# Patient Record
Sex: Female | Born: 1955 | Race: White | Hispanic: No | Marital: Single | Smoking: Never smoker
Health system: Southern US, Community
[De-identification: ages and names within clinical notes are randomized; demographics above are authoritative.]

## PROBLEM LIST (undated history)

## (undated) HISTORY — PX: TONSILLECTOMY: SUR1361

## (undated) HISTORY — PX: CHOLECYSTECTOMY: SHX55

---

## 2018-11-30 ENCOUNTER — Emergency Department (HOSPITAL_COMMUNITY)
Admission: EM | Admit: 2018-11-30 | Discharge: 2018-11-30 | Disposition: A | Discharge status: Home / Self Care | Payer: Self-pay | Attending: Emergency Medicine | Admitting: Emergency Medicine

## 2018-11-30 ENCOUNTER — Encounter (HOSPITAL_COMMUNITY): Payer: Self-pay | Admitting: Emergency Medicine

## 2018-11-30 ENCOUNTER — Other Ambulatory Visit: Payer: Self-pay

## 2018-11-30 ENCOUNTER — Emergency Department (HOSPITAL_COMMUNITY): Payer: Self-pay

## 2018-11-30 DIAGNOSIS — R10815 Periumbilic abdominal tenderness: Secondary | ICD-10-CM | POA: Insufficient documentation

## 2018-11-30 DIAGNOSIS — R10813 Right lower quadrant abdominal tenderness: Secondary | ICD-10-CM | POA: Insufficient documentation

## 2018-11-30 DIAGNOSIS — K529 Noninfective gastroenteritis and colitis, unspecified: Secondary | ICD-10-CM | POA: Insufficient documentation

## 2018-11-30 LAB — URINALYSIS, ROUTINE W REFLEX MICROSCOPIC
Bilirubin Urine: NEGATIVE
Glucose, UA: NEGATIVE mg/dL
Hgb urine dipstick: NEGATIVE
Ketones, ur: NEGATIVE mg/dL
Leukocytes,Ua: NEGATIVE
Nitrite: NEGATIVE
Protein, ur: NEGATIVE mg/dL
Specific Gravity, Urine: 1.017 (ref 1.005–1.030)
pH: 5 (ref 5.0–8.0)

## 2018-11-30 LAB — COMPREHENSIVE METABOLIC PANEL
ALT: 35 U/L (ref 0–44)
AST: 34 U/L (ref 15–41)
Albumin: 4 g/dL (ref 3.5–5.0)
Alkaline Phosphatase: 69 U/L (ref 38–126)
Anion gap: 12 (ref 5–15)
BUN: 21 mg/dL (ref 8–23)
CO2: 21 mmol/L — ABNORMAL LOW (ref 22–32)
Calcium: 9.2 mg/dL (ref 8.9–10.3)
Chloride: 107 mmol/L (ref 98–111)
Creatinine, Ser: 0.79 mg/dL (ref 0.44–1.00)
GFR calc Af Amer: >60 mL/min (ref >60)
GFR calc non Af Amer: >60 mL/min (ref >60)
Glucose, Bld: 142 mg/dL — ABNORMAL HIGH (ref 70–99)
Potassium: 4 mmol/L (ref 3.5–5.1)
Sodium: 140 mmol/L (ref 135–145)
Total Bilirubin: 0.7 mg/dL (ref 0.3–1.2)
Total Protein: 6.6 g/dL (ref 6.5–8.1)

## 2018-11-30 LAB — CBC
HCT: 37.9 % (ref 36.0–46.0)
Hemoglobin: 12.1 g/dL (ref 12.0–15.0)
MCH: 27.6 pg (ref 26.0–34.0)
MCHC: 31.9 g/dL (ref 30.0–36.0)
MCV: 86.5 fL (ref 80.0–100.0)
Platelets: 252 10*3/uL (ref 150–400)
RBC: 4.38 MIL/uL (ref 3.87–5.11)
RDW: 13.4 % (ref 11.5–15.5)
WBC: 11.7 10*3/uL — ABNORMAL HIGH (ref 4.0–10.5)
nRBC: 0 % (ref 0.0–0.2)

## 2018-11-30 LAB — LIPASE, BLOOD: Lipase: 22 U/L (ref 11–51)

## 2018-11-30 MED ORDER — OXYCODONE-ACETAMINOPHEN 5-325 MG PO TABS: 1.0000 | ORAL_TABLET | ORAL | 0 refills | Status: DC | PRN

## 2018-11-30 MED ORDER — SODIUM CHLORIDE 0.9 % IV BOLUS: 1000.0000 mL | Freq: Once | INTRAVENOUS | Status: DC

## 2018-11-30 MED ORDER — SODIUM CHLORIDE 0.9% FLUSH
3.0000 mL | Freq: Once | INTRAVENOUS | Status: AC
  Administered 2018-11-30: 3 mL via INTRAVENOUS

## 2018-11-30 MED ORDER — AMOXICILLIN-POT CLAVULANATE 875-125 MG PO TABS: 1.0000 | ORAL_TABLET | Freq: Two times a day (BID) | ORAL | 0 refills | Status: DC

## 2018-11-30 MED ORDER — ONDANSETRON 4 MG PO TBDP: 4.0000 mg | ORAL_TABLET | Freq: Three times a day (TID) | ORAL | 0 refills | Status: DC | PRN

## 2018-11-30 MED ORDER — AMOXICILLIN-POT CLAVULANATE 875-125 MG PO TABS
1.0000 | ORAL_TABLET | Freq: Once | ORAL | Status: AC
  Administered 2018-11-30: 06:00:00 1 via ORAL
  Filled 2018-11-30: qty 1

## 2018-11-30 MED ORDER — IOHEXOL 300 MG/ML  SOLN
100.0000 mL | Freq: Once | INTRAMUSCULAR | Status: AC | PRN
  Administered 2018-11-30: 05:00:00 100 mL via INTRAVENOUS

## 2018-11-30 MED ORDER — OXYCODONE-ACETAMINOPHEN 5-325 MG PO TABS
1.0000 | ORAL_TABLET | Freq: Once | ORAL | Status: AC
  Administered 2018-11-30: 06:00:00 1 via ORAL
  Filled 2018-11-30: qty 1

## 2018-11-30 NOTE — ED Notes (Signed)
Informed Sharilyn Sites PA elevated temp 100.2.

## 2018-11-30 NOTE — ED Notes (Signed)
Patient transported to CT 

## 2018-11-30 NOTE — ED Notes (Signed)
Discharge instructions discussed with pt. Pt. Verbalized understanding and Has no questions at this time. Pt. Discharged home with husband

## 2018-11-30 NOTE — ED Provider Notes (Signed)
[facility] EMERGENCY DEPARTMENT Provider Note   CSN: [id number] Arrival date & time: 11/30/18  0307    History   Chief Complaint Chief Complaint  Patient presents with  . Abdominal Pain    HPI Jasmine Branch is a 63 y.o. female.     The history is provided by the patient and medical records.  Abdominal Pain     62 y.o. F with no significant PMH, presenting to the ED for lower abdominal pain.  States she was taking a bath around 9:30 PM when she started to feel waves of pain in lower abdomen that reminded her of menstrual cramps.  States pain has since started radiating up into her mid-abdomen.  States pain continues coming in waves.  Pain better with lying flat, better with knees curled to chest or bent over.  She denies nausea, vomiting, or diarrhea.  She did not have BM yesterday but otherwise normal and able to pass gas.  She has not had any fever.  No urinary symptoms.  No irregular vaginal bleeding (postmenopausal for several years now).  She has had prior cholecystectomy.  Took motrin around midnight which eased pain a bit.  History reviewed. No pertinent past medical history.  There are no active problems to display for this patient.   History reviewed. No pertinent surgical history.   OB History   No obstetric history on file.      Home Medications    Prior to Admission medications   Not on File    Family History No family history on file.  Social History Social History   Tobacco Use  . Smoking status: Not on file  Substance Use Topics  . Alcohol use: Not on file  . Drug use: Not on file     Allergies   Patient has no known allergies.   Review of Systems Review of Systems  Gastrointestinal: Positive for abdominal pain.  All other systems reviewed and are negative.    Physical Exam Updated Vital Signs BP 106/75   Pulse 97   Temp 98.7 F (37.1 C) (Oral)   Resp 18   Ht  (1.753 m)   Wt 90.7 kg   SpO2 98%    BMI 29.53 kg/m   Physical Exam Vitals signs and nursing note reviewed.  Constitutional:      Appearance: She is well-developed.  HENT:     Head: Normocephalic and atraumatic.  Eyes:     Conjunctiva/sclera: Conjunctivae normal.     Pupils: Pupils are equal, round, and reactive to light.  Neck:     Musculoskeletal: Normal range of motion.  Cardiovascular:     Rate and Rhythm: Normal rate and regular rhythm.     Heart sounds: Normal heart sounds.  Pulmonary:     Effort: Pulmonary effort is normal.     Breath sounds: Normal breath sounds.  Abdominal:     General: Bowel sounds are normal.     Palpations: Abdomen is soft.     Tenderness: There is abdominal tenderness.       Comments: Significant tenderness periumbilical region, mildly so in RLQ and suprapubic  Musculoskeletal: Normal range of motion.  Skin:    General: Skin is warm and dry.  Neurological:     Mental Status: She is alert and oriented to person, place, and time.      ED Treatments / Results  Labs (all labs ordered are listed, but only abnormal results are displayed) Labs Reviewed  COMPREHENSIVE  METABOLIC PANEL - Abnormal; Notable for the following components:      Result Value   CO2 21 (*)    Glucose, Bld 142 (*)    All other components within normal limits  CBC - Abnormal; Notable for the following components:   WBC 11.7 (*)    All other components within normal limits  LIPASE, BLOOD  URINALYSIS, ROUTINE W REFLEX MICROSCOPIC    EKG None  Radiology Ct Abdomen Pelvis W Contrast  Result Date: 11/30/2018 CLINICAL DATA:  63 year old female with right lower quadrant abdominal pain. EXAM: CT ABDOMEN AND PELVIS WITH CONTRAST TECHNIQUE: Multidetector CT imaging of the abdomen and pelvis was performed using the standard protocol following bolus administration of intravenous contrast. CONTRAST:  OMNIPAQUE IOHEXOL 300 MG/ML  SOLN COMPARISON:  None. FINDINGS: Lower chest: Minimal lung base atelectasis.  No pericardial or pleural effusion. Hepatobiliary: Diminutive or absent gallbladder.  Negative liver. Pancreas: Negative. Spleen: Negative. Adrenals/Urinary Tract: Normal adrenal glands. 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 There are benign bilateral renal parapelvic cysts. There is no hydronephrosis. Symmetric renal enhancement and contrast excretion with normal proximal ureters. Unremarkable urinary bladder. Stomach/Bowel: Negative distal sigmoid and rectum. The proximal sigmoid is redundant tracking to the level of the umbilicus, but otherwise negative. There is diverticulosis of the descending colon which is mild-to-moderate. No active inflammation identified. Mildly redundant transverse colon is negative. Negative right colon. The cecum is on a lax mesentery located anteriorly in the right mid abdomen. The appendix is located just caudal to the cecum in the ventral abdomen subjacent to the abdominal wall on series 3, image 54 and appears normal. Negative terminal ileum. But there are mildly thickened and indistinct loops of distal small bowel in the left lower quadrant on series 3, image 70. There is associated mild mesenteric stranding (coronal image 51). Other fluid-filled but nondilated loops in this region may also be mildly inflamed, including in the right lower quadrant. There is no associated SMA atherosclerosis identified. Trace mesenteric free fluid. The mesentery of these affected small bowel loops also appears somewhat sequestered as seen on coronal image 51. No free air. The duodenum and proximal jejunum appear normal. Negative stomach aside from small hiatal hernia. Vascular/Lymphatic: Mild Calcified aortic atherosclerosis. Major arterial structures remain patent. There is no SMA atherosclerosis identified. SMA branches appear normal. Portal venous system is patent. No lymphadenopathy. Reproductive: Within normal limits. Other: Small volume pelvic free fluid in the  cul-de-sac on series 3, image 73. Musculoskeletal: No acute osseous abnormality identified. Benign bone island of the right iliac wing suspected. Advanced disc degeneration at the lumbosacral junction. IMPRESSION: Negative appendix but mildly inflamed distal small bowel loops located in both lower quadrants. Trace free fluid, no free air. The inflamed loops appear somewhat distinct from the remaining small bowel, and the cecum is also on a lax mesentery located in the ventral right abdomen. I favor acute infectious enteritis at this time. But if the patient's condition worsens consider developing closed loop obstruction such as due to internal hernia. Electronically Signed   By: Odessa Fleming M.D.   On: 11/30/2018 05:48    Procedures Procedures (including critical care time)  Medications Ordered in ED Medications  oxyCODONE-acetaminophen (PERCOCET/ROXICET) 5-325 MG per tablet 1 tablet (has no administration in time range)  amoxicillin-clavulanate (AUGMENTIN) 875-125 MG per tablet 1 tablet (has no administration in time range)  sodium chloride flush (NS) 0.9 % injection 3 mL (3 mLs Intravenous Given 11/30/18 0341)  iohexol (OMNIPAQUE) 300 MG/ML solution 100 mL (100 mLs Intravenous Contrast Given 11/30/18 0503)     Initial Impression / Assessment and Plan / ED Course  I have reviewed the triage vital signs and the nursing notes.  Pertinent labs & imaging results that were available during my care of the patient were reviewed by me and considered in my medical decision making (see chart for details).  63 y.o. F here with lower abdominal pain.  Began last night around 9:30 PM, steadily increasing and moving up into mid-abdomen now.  She is afebrile, non-toxic.  Tenderness to peri-umbilical region and mildly in RLQ.  Labs as above-- UA without any signs of infection.  WBC count 11.102F.  Patient with continued increasing pain here.  Will obtain CT scan.  She is refusing pain medication at this time.  CT  without findings of appendicitis, however does have inflamed distal small bowel loops in both lower quadrants.  It is noted that cecum is on a lax mesentery located in ventral right abdomen.  Also recommended to consider closed-loop obstruction due to internal hernia.  She does have findings of hiatal hernia on exam as well as on CT.  Will discuss with general surgery for recommendations and review of CT.  6:09 AM Discussed with general surgery, Dr. Janee Mornhompson-- he has reviewed CT scan, agrees this likely represents infectious enteritis.  He does not appreciate anything surgical at this time.  Appreciative of his input.  Results discussed with patient, she acknowledged understanding.  Will treat with course of abx and pain meds.  She was advised on use of narcotics and not driving, operating machinery, making important decisions, etc.  She will monitor symptoms closely at home, return here for any new/acute changes or worsening.  Final Clinical Impressions(s) / ED Diagnoses   Final diagnoses:  Enteritis    ED Discharge Orders         Ordered    amoxicillin-clavulanate (AUGMENTIN) 875-125 MG tablet  Every 12 hours     11/30/18 0624    oxyCODONE-acetaminophen (PERCOCET) 5-325 MG tablet  Every 4 hours PRN     11/30/18 0624    ondansetron (ZOFRAN ODT) 4 MG disintegrating tablet  Every 8 hours PRN     11/30/18 0624           Garlon HatchetSanders, Alaisha Eversley M, PA-C 11/30/18 [id number]    Palumbo, April, MD 11/30/18 [phone]

## 2018-11-30 NOTE — ED Notes (Addendum)
Pt ambulated to bathroom, only complaint was great pain when walking.

## 2018-11-30 NOTE — ED Triage Notes (Signed)
Pt from home, was taking a bath and began to have sharp, shooting pains that go from her "uterus to my stomach."  The pain then turned to "solid pain that sent me to my knees."  Has some SOB but thinks it is from the anxiety of being in the hospital.  No fever, n/v/d or other complaints.

## 2018-11-30 NOTE — Discharge Instructions (Signed)
Take the prescribed medication as directed.  Do not drive, operate heavy machinery, or make important decisions while taking narcotics. Return to the ED for new or worsening symptoms-- uncontrolled pain, vomiting, not able to hold down medications, etc.

## 2019-02-10 ENCOUNTER — Encounter (HOSPITAL_COMMUNITY): Payer: Self-pay | Admitting: Internal Medicine

## 2019-02-10 ENCOUNTER — Ambulatory Visit (HOSPITAL_COMMUNITY)
Admission: EM | Admit: 2019-02-10 | Discharge: 2019-02-10 | Disposition: A | Discharge status: Home / Self Care | Payer: Self-pay | Attending: Family Medicine | Admitting: Family Medicine

## 2019-02-10 ENCOUNTER — Other Ambulatory Visit: Payer: Self-pay

## 2019-02-10 ENCOUNTER — Inpatient Hospital Stay (HOSPITAL_COMMUNITY)
Admission: EM | Admit: 2019-02-10 | Discharge: 2019-02-13 | DRG: 372 | Disposition: A | Discharge status: Home / Self Care | Payer: Self-pay | Attending: Internal Medicine | Admitting: Internal Medicine

## 2019-02-10 ENCOUNTER — Encounter (HOSPITAL_COMMUNITY): Payer: Self-pay | Admitting: Emergency Medicine

## 2019-02-10 DIAGNOSIS — N179 Acute kidney failure, unspecified: Secondary | ICD-10-CM | POA: Insufficient documentation

## 2019-02-10 DIAGNOSIS — Z9049 Acquired absence of other specified parts of digestive tract: Secondary | ICD-10-CM

## 2019-02-10 DIAGNOSIS — E860 Dehydration: Secondary | ICD-10-CM

## 2019-02-10 DIAGNOSIS — K529 Noninfective gastroenteritis and colitis, unspecified: Secondary | ICD-10-CM | POA: Diagnosis present

## 2019-02-10 DIAGNOSIS — A020 Salmonella enteritis: Principal | ICD-10-CM | POA: Diagnosis present

## 2019-02-10 DIAGNOSIS — E876 Hypokalemia: Secondary | ICD-10-CM

## 2019-02-10 DIAGNOSIS — Z20828 Contact with and (suspected) exposure to other viral communicable diseases: Secondary | ICD-10-CM | POA: Diagnosis present

## 2019-02-10 DIAGNOSIS — A09 Infectious gastroenteritis and colitis, unspecified: Secondary | ICD-10-CM | POA: Insufficient documentation

## 2019-02-10 DIAGNOSIS — Z79899 Other long term (current) drug therapy: Secondary | ICD-10-CM

## 2019-02-10 DIAGNOSIS — K760 Fatty (change of) liver, not elsewhere classified: Secondary | ICD-10-CM | POA: Diagnosis present

## 2019-02-10 DIAGNOSIS — R197 Diarrhea, unspecified: Secondary | ICD-10-CM

## 2019-02-10 LAB — BASIC METABOLIC PANEL
Anion gap: 15 (ref 5–15)
Anion gap: 10 (ref 5–15)
BUN: 33 mg/dL — ABNORMAL HIGH (ref 8–23)
BUN: 31 mg/dL — ABNORMAL HIGH (ref 8–23)
CO2: 16 mmol/L — ABNORMAL LOW (ref 22–32)
CO2: 16 mmol/L — ABNORMAL LOW (ref 22–32)
Calcium: 9.7 mg/dL (ref 8.9–10.3)
Calcium: 8.8 mg/dL — ABNORMAL LOW (ref 8.9–10.3)
Chloride: 103 mmol/L (ref 98–111)
Chloride: 110 mmol/L (ref 98–111)
Creatinine, Ser: 2.06 mg/dL — ABNORMAL HIGH (ref 0.44–1.00)
Creatinine, Ser: 1.59 mg/dL — ABNORMAL HIGH (ref 0.44–1.00)
GFR calc Af Amer: 29 mL/min — ABNORMAL LOW (ref >60)
GFR calc Af Amer: 40 mL/min — ABNORMAL LOW (ref >60)
GFR calc non Af Amer: 25 mL/min — ABNORMAL LOW (ref >60)
GFR calc non Af Amer: 34 mL/min — ABNORMAL LOW (ref >60)
Glucose, Bld: 122 mg/dL — ABNORMAL HIGH (ref 70–99)
Glucose, Bld: 132 mg/dL — ABNORMAL HIGH (ref 70–99)
Potassium: 2.6 mmol/L — CL (ref 3.5–5.1)
Potassium: 2.7 mmol/L — CL (ref 3.5–5.1)
Sodium: 134 mmol/L — ABNORMAL LOW (ref 135–145)
Sodium: 136 mmol/L (ref 135–145)

## 2019-02-10 LAB — COMPREHENSIVE METABOLIC PANEL
ALT: 27 U/L (ref 0–44)
AST: 23 U/L (ref 15–41)
Albumin: 3.6 g/dL (ref 3.5–5.0)
Alkaline Phosphatase: 51 U/L (ref 38–126)
Anion gap: 14 (ref 5–15)
BUN: 36 mg/dL — ABNORMAL HIGH (ref 8–23)
CO2: 17 mmol/L — ABNORMAL LOW (ref 22–32)
Calcium: 9 mg/dL (ref 8.9–10.3)
Chloride: 105 mmol/L (ref 98–111)
Creatinine, Ser: 2 mg/dL — ABNORMAL HIGH (ref 0.44–1.00)
GFR calc Af Amer: 30 mL/min — ABNORMAL LOW (ref >60)
GFR calc non Af Amer: 26 mL/min — ABNORMAL LOW (ref >60)
Glucose, Bld: 124 mg/dL — ABNORMAL HIGH (ref 70–99)
Potassium: 2.6 mmol/L — CL (ref 3.5–5.1)
Sodium: 136 mmol/L (ref 135–145)
Total Bilirubin: 0.7 mg/dL (ref 0.3–1.2)
Total Protein: 6.8 g/dL (ref 6.5–8.1)

## 2019-02-10 LAB — CBC
HCT: 42.9 % (ref 36.0–46.0)
Hemoglobin: 14.3 g/dL (ref 12.0–15.0)
MCH: 27.9 pg (ref 26.0–34.0)
MCHC: 33.3 g/dL (ref 30.0–36.0)
MCV: 83.8 fL (ref 80.0–100.0)
Platelets: 311 10*3/uL (ref 150–400)
RBC: 5.12 MIL/uL — ABNORMAL HIGH (ref 3.87–5.11)
RDW: 13.6 % (ref 11.5–15.5)
WBC: 4.9 10*3/uL (ref 4.0–10.5)
nRBC: 0 % (ref 0.0–0.2)

## 2019-02-10 LAB — C DIFFICILE QUICK SCREEN W PCR REFLEX
C Diff antigen: NEGATIVE
C Diff interpretation: NOT DETECTED
C Diff toxin: NEGATIVE

## 2019-02-10 LAB — MAGNESIUM: Magnesium: 2 mg/dL (ref 1.7–2.4)

## 2019-02-10 LAB — SARS CORONAVIRUS 2 BY RT PCR (HOSPITAL ORDER, PERFORMED IN ~~LOC~~ HOSPITAL LAB): SARS Coronavirus 2: NEGATIVE

## 2019-02-10 MED ORDER — POTASSIUM CHLORIDE CRYS ER 20 MEQ PO TBCR: 20.0000 meq | EXTENDED_RELEASE_TABLET | Freq: Two times a day (BID) | ORAL | 0 refills | Status: DC

## 2019-02-10 MED ORDER — POTASSIUM CHLORIDE 10 MEQ/100ML IV SOLN
10.0000 meq | INTRAVENOUS | Status: AC
  Administered 2019-02-10: 10 meq via INTRAVENOUS
  Filled 2019-02-10: qty 100

## 2019-02-10 MED ORDER — HYDROCODONE-ACETAMINOPHEN 5-325 MG PO TABS
1.0000 | ORAL_TABLET | ORAL | Status: DC | PRN
  Filled 2019-02-10: qty 2

## 2019-02-10 MED ORDER — ACETAMINOPHEN 650 MG RE SUPP: 650.0000 mg | Freq: Four times a day (QID) | RECTAL | Status: DC | PRN

## 2019-02-10 MED ORDER — POTASSIUM CHLORIDE 10 MEQ/100ML IV SOLN
10.0000 meq | INTRAVENOUS | Status: AC
  Administered 2019-02-10 (×2): 10 meq via INTRAVENOUS
  Filled 2019-02-10 (×2): qty 100

## 2019-02-10 MED ORDER — LOPERAMIDE HCL 2 MG PO CAPS: 2.0000 mg | ORAL_CAPSULE | Freq: Four times a day (QID) | ORAL | 0 refills | Status: DC | PRN

## 2019-02-10 MED ORDER — ACETAMINOPHEN 325 MG PO TABS
650.0000 mg | ORAL_TABLET | Freq: Four times a day (QID) | ORAL | Status: DC | PRN
  Administered 2019-02-12: 650 mg via ORAL
  Filled 2019-02-10: qty 2

## 2019-02-10 MED ORDER — SODIUM CHLORIDE 0.9 % IV BOLUS
1000.0000 mL | Freq: Once | INTRAVENOUS | Status: AC
  Administered 2019-02-10: 13:00:00 1000 mL via INTRAVENOUS

## 2019-02-10 MED ORDER — SODIUM CHLORIDE 0.9 % IV SOLN
INTRAVENOUS | Status: AC
  Administered 2019-02-10 (×2): via INTRAVENOUS

## 2019-02-10 MED ORDER — LOPERAMIDE HCL 2 MG PO CAPS
4.0000 mg | ORAL_CAPSULE | Freq: Once | ORAL | Status: AC
  Administered 2019-02-10: 13:00:00 4 mg via ORAL
  Filled 2019-02-10: qty 2

## 2019-02-10 MED ORDER — ONDANSETRON HCL 4 MG PO TABS: 4.0000 mg | ORAL_TABLET | Freq: Four times a day (QID) | ORAL | Status: DC | PRN

## 2019-02-10 MED ORDER — SODIUM CHLORIDE 0.9 % IV BOLUS
1000.0000 mL | Freq: Once | INTRAVENOUS | Status: AC
  Administered 2019-02-10: 1000 mL via INTRAVENOUS

## 2019-02-10 MED ORDER — ONDANSETRON HCL 4 MG/2ML IJ SOLN: 4.0000 mg | Freq: Four times a day (QID) | INTRAMUSCULAR | Status: DC | PRN

## 2019-02-10 MED ORDER — POTASSIUM CHLORIDE 10 MEQ/100ML IV SOLN
10.0000 meq | INTRAVENOUS | Status: AC
  Administered 2019-02-10 – 2019-02-11 (×5): 10 meq via INTRAVENOUS
  Filled 2019-02-10 (×5): qty 100

## 2019-02-10 MED ORDER — POTASSIUM CHLORIDE CRYS ER 20 MEQ PO TBCR
40.0000 meq | EXTENDED_RELEASE_TABLET | Freq: Once | ORAL | Status: AC
  Administered 2019-02-10: 40 meq via ORAL
  Filled 2019-02-10: qty 2

## 2019-02-10 NOTE — H&P (Signed)
Jasmine Branch ZOX:[id number] DOB: 12-08-1955 DOA: 02/10/2019     PCP: Patient, No Pcp Per   Outpatient Specialists:   NONE    Patient arrived to ER on 02/10/19 at 1140  Patient coming from: home Lives   With family    Chief Complaint:  Chief Complaint  Patient presents with  . Diarrhea  . Emesis    HPI: Jasmine Branch is a 63 y.o. female with no significant medical history         Presented with diarrhea for 4 days severe, went to urgent care her K was noted to be  2.6 Cr was up to 2.0 no hx of CKD at baseline  She was given IV fluids because initially she did not wish to be admitted And had persistent diarrhea even while at urgent care following work-up was ordered while there  C.diff was negative, Gi panel ordered Repeat blood work was done showing still persistent AKI and hypo-kalemia at which point she was sent to emergency department  Her husband is healthy has been isolating She is a vegan cooks her own food which she gets from [employer].  They have city water.  No sick contacts currently she has been eating the same food as her husband.  They do a lot of vegetables and fruits.  She states they have been trying to wash her but sometimes may be not doing a very good job.  Reports around May she had some abdominal discomfort and was evaluated with a CAT scan for that she went home and did a cleanse after which she was feeling better up until now  Reports diarrhea resembling pond water up to 6-7 times a day. Associated some abdominal discomfort somewhat worse when she eats No blood in stool no mucus no fevers no chills no nausea vomiting associated with this.  Infectious risk factors:  Reports Diarrhea   In  ER RAPID COVID TEST NEGATIVE       While in ER:  Found to have persistent hypokalemia and Aki Ordered CT abdomen for further evaluation Was given IV potassium   The following Work up has been ordered so far:  Orders Placed This Encounter   Procedures  . C difficile quick scan w PCR reflex  . SARS Coronavirus 2 (CEPHEID - Performed in [facility]), [facility]  . CT ABDOMEN PELVIS WO CONTRAST  . Comprehensive metabolic panel  . Magnesium  . Basic metabolic panel  . Phosphorus  . CK  . HIV antibody (Routine Testing)  . Magnesium  . Phosphorus  . TSH  . Comprehensive metabolic panel  . CBC  . Diet clear liquid Room service appropriate? Yes; Fluid consistency: Thin  . Patient may eat/drink  . Cardiac monitoring  . Vital signs  . Notify physician  . Up with assistance  . If patient diabetic or glucose greater than 140 notify physician for Sliding Scale Insulin Orders  . May go off telemetry for tests/procedures  . Oral care per nursing protocol  . Initiate Oral Care Protocol  . Initiate Carrier Fluid Protocol  . RN may order General Admission PRN Orders utilizing "General Admission PRN medications" (through manage orders) for the following patient needs: allergy symptoms (Claritin), cold sores (Carmex), cough (Robitussin DM), eye irritation (Liquifilm Tears), hemorrhoids (Tucks), indigestion (Maalox), minor skin irritation (Hydrocortisone Cream), muscle pain Romeo Apple(Ben Gay), nose irritation (saline nasal spray) and sore throat (Chloraseptic spray).  . SCDs  . Patient has an active order for  admit to inpatient/place in observation  . Cardiac Monitoring Continuous x 12 hours Indications for use: Other; other indications for use: hypokalemia  . Full code  . Consult to hospitalist  ALL PATIENTS BEING ADMITTED/HAVING PROCEDURES NEED COVID-19 SCREENING  . Enteric precautions (UV disinfection)  . Pulse oximetry check with vital signs  . Oxygen therapy Mode or (Route): Nasal cannula; Liters Per Minute: 2; Keep 02 saturation: greater than 92 %  . Incentive spirometry  . EKG 12-Lead  . EKG 12-Lead  . EKG 12-Lead  . Place in observation (patient's expected length of stay will be less than 2 midnights)     Following  Medications were ordered in ER: Medications  potassium chloride 10 mEq in 100 mL IVPB (10 mEq Intravenous New Bag/Given 02/10/19 2152)  acetaminophen (TYLENOL) tablet 650 mg (has no administration in time range)    Or  acetaminophen (TYLENOL) suppository 650 mg (has no administration in time range)  HYDROcodone-acetaminophen (NORCO/VICODIN) 5-325 MG per tablet 1-2 tablet (has no administration in time range)  ondansetron (ZOFRAN) tablet 4 mg (has no administration in time range)    Or  ondansetron (ZOFRAN) injection 4 mg (has no administration in time range)  0.9 %  sodium chloride infusion ( Intravenous New Bag/Given 02/10/19 2356)  potassium chloride 10 mEq in 100 mL IVPB (10 mEq Intravenous New Bag/Given 02/10/19 2358)  potassium chloride SA (K-DUR) CR tablet 40 mEq (40 mEq Oral Given 02/10/19 1247)  potassium chloride 10 mEq in 100 mL IVPB (0 mEq Intravenous Stopped 02/10/19 1520)  sodium chloride 0.9 % bolus 1,000 mL (0 mLs Intravenous Stopped 02/10/19 1400)  loperamide (IMODIUM) capsule 4 mg (4 mg Oral Given 02/10/19 1247)        Consult Orders  (From admission, onward)         Start     Ordered   02/10/19 2052  Consult to hospitalist  ALL PATIENTS BEING ADMITTED/HAVING PROCEDURES NEED COVID-19 SCREENING  Once    Comments: ALL PATIENTS BEING ADMITTED/HAVING PROCEDURES NEED COVID-19 SCREENING  Provider:  (Not yet assigned)  Question Answer Comment  Place call to: [facility]   Reason for Consult Admit      02/10/19 2051          Significant initial  Findings: Abnormal Labs Reviewed  COMPREHENSIVE METABOLIC PANEL - Abnormal; Notable for the following components:      Result Value   Potassium 2.6 (*)    CO2 17 (*)    Glucose, Bld 124 (*)    BUN 36 (*)    Creatinine, Ser 2.00 (*)    GFR calc non Af Amer 26 (*)    GFR calc Af Amer 30 (*)    All other components within normal limits  BASIC METABOLIC PANEL - Abnormal; Notable for the following components:   Potassium  2.7 (*)    CO2 16 (*)    Glucose, Bld 132 (*)    BUN 31 (*)    Creatinine, Ser 1.59 (*)    Calcium 8.8 (*)    GFR calc non Af Amer 34 (*)    GFR calc Af Amer 40 (*)    All other components within normal limits     Otherwise labs showing:    Recent Labs  Lab 02/10/19 0942 02/10/19 1231 02/10/19 1930  NA 134* 136 136  K 2.6* 2.6* 2.7*  CO2 16* 17* 16*  GLUCOSE 122* 124* 132*  BUN 33* 36* 31*  CREATININE 2.06* 2.00* 1.59*  CALCIUM 9.7  9.0 8.8*  MG  --  2.0  --   PHOS  --   --  2.6    Cr   stable,    Lab Results  Component Value Date   CREATININE 1.59 (H) 02/10/2019   CREATININE 2.00 (H) 02/10/2019   CREATININE 2.06 (H) 02/10/2019    Recent Labs  Lab 02/10/19 1231  AST 23  ALT 27  ALKPHOS 51  BILITOT 0.7  PROT 6.8  ALBUMIN 3.6   Lab Results  Component Value Date   CALCIUM 8.8 (L) 02/10/2019   PHOS 2.6 02/10/2019      WBC       Component Value Date/Time   WBC 4.9 02/10/2019 0942   ANC No results found for: NEUTROABS ALC No results found for: LYMPHOABS    Plt: Lab Results  Component Value Date   PLT 311 02/10/2019     Lactic Acid, Venous No results found for: LATICACIDVEN      COVID-19 Labs     Lab Results  Component Value Date   SARSCOV2NAA NEGATIVE 02/10/2019    HG/HCT  stable,       Component Value Date/Time   HGB 14.3 02/10/2019 0942   HCT 42.9 02/10/2019 0942       UA  not ordered   Urine analysis:    Component Value Date/Time   COLORURINE YELLOW 11/30/2018 0321   APPEARANCEUR CLEAR 11/30/2018 0321   LABSPEC 1.017 11/30/2018 0321   PHURINE 5.0 11/30/2018 0321   GLUCOSEU NEGATIVE 11/30/2018 0321   HGBUR NEGATIVE 11/30/2018 0321   BILIRUBINUR NEGATIVE 11/30/2018 0321   KETONESUR NEGATIVE 11/30/2018 0321   PROTEINUR NEGATIVE 11/30/2018 0321   NITRITE NEGATIVE 11/30/2018 0321   LEUKOCYTESUR NEGATIVE 11/30/2018 0321       CTabd/pelvis -most likely showing enteritis    ECG: Ordered     ED Triage Vitals   Enc Vitals Group     BP 02/10/19 1155 100/70     Pulse Rate 02/10/19 1155 94     Resp 02/10/19 1155 16     Temp 02/10/19 1155 97.7 F (36.5 C)     Temp Source 02/10/19 1155 Oral     SpO2 02/10/19 1155 100 %     Weight 02/10/19 1250 195 lb (88.5 kg)     Height 02/10/19 1250  (1.753 m)     Head Circumference --      Peak Flow --      Pain Score 02/10/19 1249 0     Pain Loc --      Pain Edu? --      Excl. in GC? --   TMAX(24)@       Latest  Blood pressure 105/63, pulse 86, temperature 97.7 F (36.5 C), temperature source Oral, resp. rate 19, height  (1.753 m), weight 88.5 kg, SpO2 97 %.    Hospitalist was called for admission for dehydration hypokalemia   Review of Systems:    Pertinent positives include: abdominal pain, diarrhea, Constitutional:  No weight loss, night sweats, Fevers, chills, fatigue, weight loss  HEENT:  No headaches, Difficulty swallowing,Tooth/dental problems,Sore throat,  No sneezing, itching, ear ache, nasal congestion, post nasal drip,  Cardio-vascular:  No chest pain, Orthopnea, PND, anasarca, dizziness, palpitations.no Bilateral lower extremity swelling  GI:  No heartburn, indigestion,  nausea, vomiting,  change in bowel habits, loss of appetite, melena, blood in stool, hematemesis Resp:  no shortness of breath at rest. No dyspnea on exertion, No excess mucus, no productive cough,  No non-productive cough, No coughing up of blood.No change in color of mucus.No wheezing. Skin:  no rash or lesions. No jaundice GU:  no dysuria, change in color of urine, no urgency or frequency. No straining to urinate.  No flank pain.  Musculoskeletal:  No joint pain or no joint swelling. No decreased range of motion. No back pain.  Psych:  No change in mood or affect. No depression or anxiety. No memory loss.  Neuro: no localizing neurological complaints, no tingling, no weakness, no double vision, no gait abnormality, no slurred speech, no confusion   All systems reviewed and apart from HOPI all are negative  Past Medical History:  History reviewed. No pertinent past medical history.    Past Surgical History:  Procedure Laterality Date  . CHOLECYSTECTOMY    . TONSILLECTOMY      Social History:  Ambulatory  Independently      reports that she has never smoked. She has never used smokeless tobacco. She reports previous alcohol use. She reports previous drug use.     Family History:   Family History  Problem Relation Age of Onset  . Hypertension Neg Hx   . Diabetes Neg Hx     Allergies: No Known Allergies   Prior to Admission medications   Medication Sig Start Date End Date Taking? Authorizing Provider  Ascorbic Acid (VITAMIN C PO) Take 1 tablet by mouth daily.   Yes [provider]  Cyanocobalamin (VITAMIN B-12 PO) Take 1 tablet by mouth daily.   Yes [provider]  MAGNESIUM PO Take 1 tablet by mouth daily.   Yes [provider]  OVER THE COUNTER MEDICATION Take 1 tablet by mouth daily. "blood builder" from [employer]   Yes [provider]  OVER THE COUNTER MEDICATION Take 1 tablet by mouth daily. "wheat grass" from [employer]   Yes [provider]  SPIRULINA PO Take 1 tablet by mouth daily.   Yes [provider]  loperamide (IMODIUM) 2 MG capsule Take 1 capsule (2 mg total) by mouth 4 (four) times daily as needed for diarrhea or loose stools. 02/10/19   Terrilee FilesButler, Michael C, MD  potassium chloride SA (K-DUR) 20 MEQ tablet Take 1 tablet (20 mEq total) by mouth 2 (two) times daily. 02/10/19   Terrilee FilesButler, Michael C, MD   Physical Exam: Blood pressure 105/63, pulse 86, temperature 97.7 F (36.5 C), temperature source Oral, resp. rate 19, height 5\' 9"  (1.753 m), weight 88.5 kg, SpO2 97 %. 1. General:  in No  Acute distress   well  -appearing 2. Psychological: Alert and  Oriented 3. Head/ENT:    Dry Mucous Membranes                          Head Non traumatic, neck supple                          Poor Dentition 4. SKIN   decreased Skin turgor,  Skin clean Dry and intact no rash 5. Heart: Regular rate and rhythm no  Murmur, no Rub or gallop 6. Lungs:  Clear to auscultation bilaterally, no wheezes or crackles   7. Abdomen: Soft, mildly tender, Non distended   obese  bowel sounds present 8. Lower extremities: no clubbing, cyanosis, no  edema 9. Neurologically Grossly intact, moving all 4 extremities equally   10. MSK: Normal range of motion   All other LABS:  Recent Labs  Lab 02/10/19 0942  WBC 4.9  HGB 14.3  HCT 42.9  MCV 83.8  PLT 311     Recent Labs  Lab 02/10/19 0942 02/10/19 1231 02/10/19 1930  NA 134* 136 136  K 2.6* 2.6* 2.7*  CL 103 105 110  CO2 16* 17* 16*  GLUCOSE 122* 124* 132*  BUN 33* 36* 31*  CREATININE 2.06* 2.00* 1.59*  CALCIUM 9.7 9.0 8.8*  MG  --  2.0  --   PHOS  --   --  2.6     Recent Labs  Lab 02/10/19 1231  AST 23  ALT 27  ALKPHOS 51  BILITOT 0.7  PROT 6.8  ALBUMIN 3.6       Cultures: No results found for: SDES, SPECREQUEST, CULT, REPTSTATUS   Radiological Exams on Admission: No results found.  Chart has been reviewed    Assessment/Plan   63 y.o. female with no significant medical history   Admitted for enteritis resulting in AKI and hypokalemia  Present on Admission: . Enteritis -we will rehydrate given no leukocytosis of fever hold off on IV antibiotics for now gastric panel ordered supportive management awaiting results of gastric panel and treat as needed. If worsens or develops significant abdominal discomfort may need to repeat imaging as it was a possibility of internal hernia  . Hypokalemia -replace check magnesium level . Dehydration -continue to rehydrate . AKI (acute kidney injury) ([place]) most likely secondary to dehydration we will rehydrate and continue to follow   Other plan as per orders.  DVT prophylaxis:  SCD   Code Status:  FULL CODE as per  patient I had personally  discussed CODE STATUS with patient    Family Communication:   Family not at  Bedside    Disposition Plan:      To home once workup is complete and patient is stable                                        Consults called: none  Admission status:  ED Disposition    ED Disposition Condition [place]: [place] [[id number]]  Level of Care: Telemetry Medical [104]  I expect the patient will be discharged within 24 hours: Yes  LOW acuity---Tx typically complete <24 hrs---ACUTE conditions typically can be evaluated <24 hours---LABS likely to return to acceptable levels <24 hours---IS near functional baseline---EXPECTED to return to current living arrangement---NOT newly hypoxic: Meets criteria for 5C-Observation unit  Covid Evaluation: Confirmed COVID Negative  Diagnosis: Enteritis [[id number]]  Admitting Physician: Toy Baker [[id number]]  Attending Physician: Toy Baker [[id number]]  PT Class (Do Not Modify): Observation [104]  PT Acc Code (Do Not Modify): Observation [10022]         Obs    Level of care      tele  For 12H    Precautions:  Enteric precautions (UV disinfection)  PPE: Used by the provider:   P100  eye Goggles,  Gloves  gown   Vrishank Moster 02/11/2019, 12:49 AM    [facility]     after 2 AM please page floor coverage PA If 7AM-7PM, please contact the day team taking care of the patient using [email or web address]

## 2019-02-10 NOTE — ED Notes (Signed)
ED TO INPATIENT HANDOFF REPORT  ED Nurse Name and Phone #: Annie Main [phone]  S Name/Age/Gender Jasmine Branch 63 y.o. female Room/Bed: 045C/045C  Code Status   Code Status: Not on file  Home/SNF/Other Home Patient oriented to: self, place, time and situation Is this baseline? Yes   Triage Complete: Triage complete  Chief Complaint crital labs from ucc,vomiting,diarrhea  Triage Note Pt sent [facility] from [facility] for diarrhea, vomiting, low potassium levels. Pt has had n/v/d fatigue x 3 days. VSS   Allergies No Known Allergies  Level of Care/Admitting Diagnosis ED Disposition    ED Disposition Condition [facility]: [place] [[id number]]  Level of Care: Telemetry Medical [104]  I expect the patient will be discharged within 24 hours: Yes  LOW acuity---Tx typically complete <24 hrs---ACUTE conditions typically can be evaluated <24 hours---LABS likely to return to acceptable levels <24 hours---IS near functional baseline---EXPECTED to return to current living arrangement---NOT newly hypoxic: Meets criteria for 5C-Observation unit  Covid Evaluation: Confirmed COVID Negative  Diagnosis: Enteritis [[id number]]  Admitting Physician: Toy Baker [[id number]]  Attending Physician: Toy Baker [[id number]]  PT Class (Do Not Modify): Observation [104]  PT Acc Code (Do Not Modify): Observation [10022]       B Medical/Surgery History No past medical history on file. No past surgical history on file.   A IV Location/Drains/Wounds Patient Lines/Drains/Airways Status   Active Line/Drains/Airways    Name:   Placement date:   Placement time:   Site:   Days:   Peripheral IV 02/10/19 Right Hand   02/10/19    1002    Hand   less than 1          Intake/Output Last 24 hours  Intake/Output Summary (Last 24 hours) at 02/10/2019 2153 Last data filed at 02/10/2019 1520 Gross per 24 hour  Intake 1205.01 ml  Output -  Net 1205.01 ml     Labs/Imaging Results for orders placed or performed during the hospital encounter of 02/10/19 (from the past 48 hour(s))  Comprehensive metabolic panel     Status: Abnormal   Collection Time: 02/10/19 12:31 PM  Result Value Ref Range   Sodium 136 135 - 145 mmol/L   Potassium 2.6 (LL) 3.5 - 5.1 mmol/L    Comment: CRITICAL RESULT CALLED TO, READ BACK BY AND VERIFIED WITH: HOLLY HALL,RN AT 1245 02/10/2019 BY ZBEECH.    Chloride 105 98 - 111 mmol/L   CO2 17 (L) 22 - 32 mmol/L   Glucose, Bld 124 (H) 70 - 99 mg/dL   BUN 36 (H) 8 - 23 mg/dL   Creatinine, Ser 2.00 (H) 0.44 - 1.00 mg/dL   Calcium 9.0 8.9 - 10.3 mg/dL   Total Protein 6.8 6.5 - 8.1 g/dL   Albumin 3.6 3.5 - 5.0 g/dL   AST 23 15 - 41 U/L   ALT 27 0 - 44 U/L   Alkaline Phosphatase 51 38 - 126 U/L   Total Bilirubin 0.7 0.3 - 1.2 mg/dL   GFR calc non Af Amer 26 (L) >60 mL/min   GFR calc Af Amer 30 (L) >60 mL/min   Anion gap 14 5 - 15    Comment: Performed at [facility], [address], [place], [place] [address]  Magnesium     Status: None   Collection Time: 02/10/19 12:31 PM  Result Value Ref Range   Magnesium 2.0 1.7 - 2.4 mg/dL    Comment: Performed at [facility], [address] [place], [place] [address]  SARS Coronavirus 2 (CEPHEID - Performed in [facility] hospital lab), Hosp Order     Status: None   Collection Time: 02/10/19 12:52 PM   Specimen: Nasopharyngeal Swab  Result Value Ref Range   SARS Coronavirus 2 NEGATIVE NEGATIVE    Comment: (NOTE) If result is NEGATIVE SARS-CoV-2 target nucleic acids are NOT DETECTED. The SARS-CoV-2 RNA is generally detectable in upper and lower  respiratory specimens during the acute phase of infection. The lowest  concentration of SARS-CoV-2 viral copies this assay can detect is 250  copies / mL. A negative result does not preclude SARS-CoV-2 infection  and should not be used as the sole basis for treatment or other  patient management decisions.  A negative  result may occur with  improper specimen collection / handling, submission of specimen other  than nasopharyngeal swab, presence of viral mutation(s) within the  areas targeted by this assay, and inadequate number of viral copies  (<250 copies / mL). A negative result must be combined with clinical  observations, patient history, and epidemiological information. If result is POSITIVE SARS-CoV-2 target nucleic acids are DETECTED. The SARS-CoV-2 RNA is generally detectable in upper and lower  respiratory specimens dur ing the acute phase of infection.  Positive  results are indicative of active infection with SARS-CoV-2.  Clinical  correlation with patient history and other diagnostic information is  necessary to determine patient infection status.  Positive results do  not rule out bacterial infection or co-infection with other viruses. If result is PRESUMPTIVE POSTIVE SARS-CoV-2 nucleic acids MAY BE PRESENT.   A presumptive positive result was obtained on the submitted specimen  and confirmed on repeat testing.  While 2019 novel coronavirus  (SARS-CoV-2) nucleic acids may be present in the submitted sample  additional confirmatory testing may be necessary for epidemiological  and / or clinical management purposes  to differentiate between  SARS-CoV-2 and other Sarbecovirus currently known to infect humans.  If clinically indicated additional testing with an alternate test  methodology [phone]([id number]) is advised. The SARS-CoV-2 RNA is generally  detectable in upper and lower respiratory sp ecimens during the acute  phase of infection. The expected result is Negative. Fact Sheet for Patients:  BoilerBrush.com.cyhttps://www.fda.gov/media/136312/download Fact Sheet for Healthcare Providers: https://pope.com/https://www.fda.gov/media/136313/download This test is not yet approved or cleared by the [place] FDA and has been authorized for detection and/or diagnosis of SARS-CoV-2 by FDA under an Emergency Use Authorization  (EUA).  This EUA will remain in effect (meaning this test can be used) for the duration of the COVID-19 declaration under Section 564(b)(1) of the Act, 21 U.S.C. section 360bbb-3(b)(1), unless the authorization is terminated or revoked sooner. Performed at [facility], [address] [place], [place] [address]   C difficile quick scan w PCR reflex     Status: None   Collection Time: 02/10/19  3:45 PM   Specimen: STOOL  Result Value Ref Range   C Diff antigen NEGATIVE NEGATIVE   C Diff toxin NEGATIVE NEGATIVE   C Diff interpretation No C. difficile detected.     Comment: NEGATIVE Performed at [facility], [address] [place], [place] [address]   Basic metabolic panel     Status: Abnormal   Collection Time: 02/10/19  7:30 PM  Result Value Ref Range   Sodium 136 135 - 145 mmol/L   Potassium 2.7 (LL) 3.5 - 5.1 mmol/L    Comment: CRITICAL RESULT CALLED  TO, READ BACK BY AND VERIFIED WITH: P PEREZ,RN 2028 02/10/2019 D BRADLEY    Chloride 110 98 - 111 mmol/L   CO2 16 (L) 22 - 32 mmol/L   Glucose, Bld 132 (H) 70 - 99 mg/dL   BUN 31 (H) 8 - 23 mg/dL   Creatinine, Ser 1.611.59 (H) 0.44 - 1.00 mg/dL   Calcium 8.8 (L) 8.9 - 10.3 mg/dL   GFR calc non Af Amer 34 (L) >60 mL/min   GFR calc Af Amer 40 (L) >60 mL/min   Anion gap 10 5 - 15    Comment: Performed at [facility], [address] [place], [place] [address]   No results found.  Pending Labs Unresulted Labs (From admission, onward)    Start     Ordered   02/10/19 2133  CK  Add-on,   AD     02/10/19 2132   02/10/19 2132  Phosphorus  Add-on,   AD     02/10/19 2132   Signed and Held  HIV antibody (Routine Testing)  Tomorrow morning,   R     Signed and Held   Signed and Held  Magnesium  Tomorrow morning,   R    Comments: Call MD if <1.5    Signed and Held   Signed and Held  Phosphorus  Tomorrow morning,   R     Signed and Held   Signed and Held  TSH  Once,   R    Comments: Cancel if already done within 1  month and notify MD    Signed and Held   Signed and Held  Comprehensive metabolic panel  Once,   R    Comments: Cal MD for K<3.5 or >5.0    Signed and Held   Signed and Held  CBC  Once,   R    Comments: Call for hg <8.0    Signed and Held          Vitals/Pain Today's Vitals   02/10/19 1500 02/10/19 1547 02/10/19 1700 02/10/19 1715  BP:  103/65 98/60 105/63  Pulse: 76 79 84 86  Resp: 17 13 15 19   Temp:      TempSrc:      SpO2: 98% 99% 99% 97%  Weight:      Height:      PainSc:        Isolation Precautions Enteric precautions (UV disinfection)  Medications Medications  potassium chloride 10 mEq in 100 mL IVPB (10 mEq Intravenous New Bag/Given 02/10/19 2152)  potassium chloride SA (K-DUR) CR tablet 40 mEq (40 mEq Oral Given 02/10/19 1247)  potassium chloride 10 mEq in 100 mL IVPB (0 mEq Intravenous Stopped 02/10/19 1520)  sodium chloride 0.9 % bolus 1,000 mL (0 mLs Intravenous Stopped 02/10/19 1400)  loperamide (IMODIUM) capsule 4 mg (4 mg Oral Given 02/10/19 1247)    Mobility walks Low fall risk   Focused Assessments Cardiac Assessment Handoff:  Cardiac Rhythm: Normal sinus rhythm No results found for: CKTOTAL, CKMB, CKMBINDEX, TROPONINI No results found for: DDIMER Does the Patient currently have chest pain? No      R Recommendations: See Admitting Provider Note  Report given to:   Additional Notes:

## 2019-02-10 NOTE — ED Notes (Signed)
Dr. Melina Copa aware of hypokalemia

## 2019-02-10 NOTE — ED Notes (Signed)
Pt give food and beverage per MD order

## 2019-02-10 NOTE — ED Provider Notes (Signed)
[facility] Provider Note   CSN: [id number] Arrival date & time: 02/10/19  1140     History   Chief Complaint Chief Complaint  Patient presents with   Diarrhea   Emesis    HPI Norval Mortonamara Jo Chapel is a 63 y.o. female.  She has no significant past medical history.  She says about 8 weeks ago she was in the ER with diarrhea and ultimately ended up getting treated with 10 days of Augmentin with improvement in her symptoms.  She has been doing well until 4 days ago when she started with explosive watery diarrhea 6 or 8 episodes a day foul-smelling.  There is been no real abdominal pain.  No vomiting but she has been nauseous if she tries to eat.  No fevers or chills no sick contacts no recent travel.  She said she feels extremely tired and achy.  Partner not sick.  She said she does a lot of hiking but does not drink any stream water and does not recall any tick bites.  She went to urgent care today and was found to have a potassium of 2.6 and an elevated creatinine of 2.0.     The history is provided by the patient.  Diarrhea Quality:  Watery Severity:  Severe Onset quality:  Sudden Number of episodes:  20+ Duration:  4 days Timing:  Intermittent Progression:  Unchanged Relieved by:  Nothing Worsened by:  Nothing Ineffective treatments:  None tried Associated symptoms: myalgias   Associated symptoms: no abdominal pain, no recent cough, no fever, no headaches and no vomiting   Risk factors: recent antibiotic use   Risk factors: no sick contacts, no suspicious food intake and no travel to endemic areas     No past medical history on file.  There are no active problems to display for this patient.   No past surgical history on file.   OB History   No obstetric history on file.      Home Medications    Prior to Admission medications   Not on File    Family History Family History  Family history unknown: Yes    Social  History Social History   Tobacco Use   Smoking status: Never Smoker  Substance Use Topics   Alcohol use: Not Currently   Drug use: Not Currently     Allergies   Patient has no known allergies.   Review of Systems Review of Systems  Constitutional: Positive for fatigue. Negative for fever.  HENT: Negative for sore throat.   Eyes: Negative for visual disturbance.  Respiratory: Negative for shortness of breath.   Cardiovascular: Negative for chest pain.  Gastrointestinal: Positive for diarrhea and nausea. Negative for abdominal pain, anal bleeding, blood in stool and vomiting.  Genitourinary: Negative for dysuria.  Musculoskeletal: Positive for myalgias.  Skin: Negative for rash.  Neurological: Negative for headaches.     Physical Exam Updated Vital Signs BP 100/70 (BP Location: Right Arm)    Pulse 94    Temp 97.7 F (36.5 C) (Oral)    Resp 16    SpO2 100%   Physical Exam Vitals signs and nursing note reviewed.  Constitutional:      General: She is not in acute distress.    Appearance: She is well-developed.  HENT:     Head: Normocephalic and atraumatic.  Eyes:     Conjunctiva/sclera: Conjunctivae normal.  Neck:     Musculoskeletal: Neck supple.  Cardiovascular:  Rate and Rhythm: Normal rate and regular rhythm.     Heart sounds: No murmur.  Pulmonary:     Effort: Pulmonary effort is normal. No respiratory distress.     Breath sounds: Normal breath sounds.  Abdominal:     Palpations: Abdomen is soft.     Tenderness: There is no abdominal tenderness.  Musculoskeletal: Normal range of motion.     Right lower leg: No edema.     Left lower leg: No edema.  Skin:    General: Skin is warm and dry.     Capillary Refill: Capillary refill takes less than 2 seconds.  Neurological:     General: No focal deficit present.     Mental Status: She is alert and oriented to person, place, and time.      ED Treatments / Results  Labs (all labs ordered are listed,  but only abnormal results are displayed) Labs Reviewed  COMPREHENSIVE METABOLIC PANEL - Abnormal; Notable for the following components:      Result Value   Potassium 2.6 (*)    CO2 17 (*)    Glucose, Bld 124 (*)    BUN 36 (*)    Creatinine, Ser 2.00 (*)    GFR calc non Af Amer 26 (*)    GFR calc Af Amer 30 (*)    All other components within normal limits  BASIC METABOLIC PANEL - Abnormal; Notable for the following components:   Potassium 2.7 (*)    CO2 16 (*)    Glucose, Bld 132 (*)    BUN 31 (*)    Creatinine, Ser 1.59 (*)    Calcium 8.8 (*)    GFR calc non Af Amer 34 (*)    GFR calc Af Amer 40 (*)    All other components within normal limits  C DIFFICILE QUICK SCREEN W PCR REFLEX  SARS CORONAVIRUS 2 (HOSPITAL ORDER, [place] [facility])  MAGNESIUM  PHOSPHORUS  CK  CBC  COMPREHENSIVE METABOLIC PANEL  HIV ANTIBODY (ROUTINE TESTING W REFLEX)  MAGNESIUM  PHOSPHORUS  TSH    EKG None  Radiology Ct Abdomen Pelvis Wo Contrast  Result Date: 02/11/2019 CLINICAL DATA:  Persistent diarrhea. EXAM: CT ABDOMEN AND PELVIS WITHOUT CONTRAST TECHNIQUE: Multidetector CT imaging of the abdomen and pelvis was performed following the standard protocol without IV contrast. COMPARISON:  Nov 30, 2018 FINDINGS: Lower chest: The lung bases are clear. The heart size is normal. Hepatobiliary: There is decreased hepatic attenuation suggestive of hepatic steatosis. Status post cholecystectomy.There is no biliary ductal dilation. Pancreas: Normal contours without ductal dilatation. No peripancreatic fluid collection. Spleen: No splenic laceration or hematoma. Adrenals/Urinary Tract: --Adrenal glands: No adrenal hemorrhage. --Right kidney/ureter: No hydronephrosis or perinephric hematoma. --Left kidney/ureter: No hydronephrosis or perinephric hematoma. --Urinary bladder: Unremarkable. Stomach/Bowel: --Stomach/Duodenum: No hiatal hernia or other gastric abnormality. Normal duodenal course  and caliber. --Small bowel: No dilatation or inflammation. --Colon: There is liquid stool in the colon. There are few scattered colonic diverticula without CT evidence of diverticulitis. --Appendix: Normal. Vascular/Lymphatic: Atherosclerotic calcification is present within the non-aneurysmal abdominal aorta, without hemodynamically significant stenosis. --No retroperitoneal lymphadenopathy. --No mesenteric lymphadenopathy. --No pelvic or inguinal lymphadenopathy. Reproductive: There is a fibroid uterus. Other: No ascites or free air. The abdominal wall is normal. Musculoskeletal. No acute displaced fractures. IMPRESSION: 1. Liquid stool consistent with diarrhea. 2. Hepatic steatosis. 3. Fibroid uterus. 4. Normal appendix in the right lower quadrant. No CT evidence for enteritis, however evaluation is limited by lack  of IV contrast. Electronically Signed   By: Katherine Mantlehristopher  Green M.D.   On: 02/11/2019 01:11    Procedures Procedures (including critical care time)  Medications Ordered in ED Medications  potassium chloride SA (K-DUR) CR tablet 40 mEq (has no administration in time range)  potassium chloride 10 mEq in 100 mL IVPB (has no administration in time range)  sodium chloride 0.9 % bolus 1,000 mL (has no administration in time range)  loperamide (IMODIUM) capsule 4 mg (has no administration in time range)     Initial Impression / Assessment and Plan / ED Course  I have reviewed the triage vital signs and the nursing notes.  Pertinent labs & imaging results that were available during my care of the patient were reviewed by me and considered in my medical decision making (see chart for details).  Clinical Course as of Feb 11 835  Fri Feb 10, 2019  [id number] 63 year old female here with 4 days of watery diarrhea multiple episodes a day feeling generalized weak and found to be with a new AKI and hypokalemia.  Differential includes gastroenteritis, metabolic derangement, infectious diarrhea, C.  difficile, Covid.   [MB]  1506 Patient states she is feeling a bit better.  Her electrolytes likely should get her admitted with potassium of 2.6 and elevated creatinine but she is hoping to be able to go home.  I have given her oral and IV potassium along with IV fluids.  We will recheck her electrolytes in a little bit and if she is improving she was hoping to be able to be discharged.   [MB]    Clinical Course User Index [MB] Terrilee FilesButler, Anastasios Melander C, MD   Norval Mortonamara Jo Kluever was evaluated in Emergency Department on 02/10/2019 for the symptoms described in the history of present illness. She was evaluated in the context of the global COVID-19 pandemic, which necessitated consideration that the patient might be at risk for infection with the SARS-CoV-2 virus that causes COVID-19. Institutional protocols and algorithms that pertain to the evaluation of patients at risk for COVID-19 are in a state of rapid change based on information released by regulatory bodies including the CDC and federal and state organizations. These policies and algorithms were followed during the patient's care in the ED.      Final Clinical Impressions(s) / ED Diagnoses   Final diagnoses:  Diarrhea, unspecified type  Hypokalemia  Dehydration  AKI (acute kidney injury) [facility](HCC)    ED Discharge Orders    None       Terrilee FilesButler, Lucindy Borel C, MD 02/11/19 [phone]

## 2019-02-10 NOTE — Discharge Instructions (Addendum)
You need to be treated in the ER for the low potassium and dehydration and kidney s

## 2019-02-10 NOTE — ED Notes (Signed)
Patient is being discharged from the Urgent [place] and sent to the Emergency Department via wheelchair by staff. Per Dr. Meda Coffee, patient is stable but in need of higher level of care due to low potassium. Patient is aware and verbalizes understanding of plan of care. Report called to Catherine, RN Vitals:   02/10/19 0910 02/10/19 0912  BP: 106/70   Pulse: (!) 103   Resp: 18   Temp:  97.9 F (36.6 C)  SpO2: 99%

## 2019-02-10 NOTE — Progress Notes (Signed)
Called to get report. Unable to get in contact with nurse Annie Main message left with secretary for him to call back.

## 2019-02-10 NOTE — ED Notes (Signed)
Pt has IV in place from [facility]

## 2019-02-10 NOTE — ED Notes (Signed)
Attempted report x1. 

## 2019-02-10 NOTE — ED Triage Notes (Signed)
Pt states Tuesday morning she started having liquid diarrhea. C/o feeling weak and fatigued.

## 2019-02-10 NOTE — ED Triage Notes (Signed)
Pt sent here from [facility] for diarrhea, vomiting, low potassium levels. Pt has had n/v/d fatigue x 3 days. VSS

## 2019-02-10 NOTE — ED Provider Notes (Signed)
[facility]    CSN: [id number] Arrival date & time: 02/10/19  [id number]     History   Chief Complaint Chief Complaint  Patient presents with  . Appointment    850  . Diarrhea    HPI Jasmine Branch is a 63 y.o. female.   HPI  Patient is here for diarrhea.  Is been present for 4 days now.  She states that she has multiple watery diarrhea all throughout the day.  Anytime she eats or drinks anything that stimulates cramping and diarrhea.  She is had some nausea.  Decreased appetite.  No known exposure to illness.  She states that she is "paranoid" of COVID-19 and has been staying home and wearing a mask.  No fever or chills.  No respiratory symptoms.  She does feel achy, but thinks the fatigue and achiness is from dehydration.  Her partner at home is not sick.  Eats the same food that she does.  She states that she is a vegan.  Eats a lot of fruits and vegetables. She did take 10 days of antibiotics back in May.  Is worried that she could have C. difficile.  I told her that after 2 months it was unlikely. She was in the emergency room in May for "enteritis".  Had an abnormal CT scan.  She did not have a bowel obstruction was sent home and recovered conservatively.  History reviewed. No pertinent past medical history.  There are no active problems to display for this patient.   History reviewed. No pertinent surgical history.  OB History   No obstetric history on file.      Home Medications    Prior to Admission medications   Not on File  None  Family History Family History  Family history unknown: Yes    Social History Social History   Tobacco Use  . Smoking status: Never Smoker  Substance Use Topics  . Alcohol use: Not Currently  . Drug use: Not Currently   Patient states that she did smoke for 45 years.  Quit several years ago  Allergies   Patient has no known allergies.   Review of Systems Review of Systems  Constitutional: Positive for  appetite change and fatigue. Negative for chills and fever.  HENT: Negative for ear pain and sore throat.   Eyes: Negative for pain and visual disturbance.  Respiratory: Negative for cough and shortness of breath.   Cardiovascular: Negative for chest pain and palpitations.  Gastrointestinal: Positive for diarrhea and nausea. Negative for abdominal pain and vomiting.  Genitourinary: Negative for dysuria and hematuria.  Musculoskeletal: Negative for arthralgias and back pain.  Skin: Negative for color change and rash.  Neurological: Negative for seizures and syncope.  All other systems reviewed and are negative.    Physical Exam Triage Vital Signs ED Triage Vitals  Enc Vitals Group     BP 02/10/19 0910 106/70     Pulse Rate 02/10/19 0910 (!) 103     Resp 02/10/19 0910 18     Temp 02/10/19 0912 97.9 F (36.6 C)     Temp src --      SpO2 02/10/19 0910 99 %     Weight --      Height --      Head Circumference --      Peak Flow --      Pain Score 02/10/19 0911 0     Pain Loc --      Pain Edu? --  Excl. in GC? --    No data found.  Updated Vital Signs BP 106/70   Pulse (!) 103   Temp 97.9 F (36.6 C)   Resp 18   SpO2 99%      Physical Exam Constitutional:      General: She is not in acute distress.    Appearance: She is well-developed.  HENT:     Head: Normocephalic and atraumatic.     Mouth/Throat:     Mouth: Mucous membranes are dry.     Comments: Mouth is dry.  Skin tents on top of hand Eyes:     Conjunctiva/sclera: Conjunctivae normal.     Pupils: Pupils are equal, round, and reactive to light.  Neck:     Musculoskeletal: Normal range of motion.  Cardiovascular:     Rate and Rhythm: Normal rate.  Pulmonary:     Effort: Pulmonary effort is normal. No respiratory distress.  Abdominal:     General: Abdomen is flat. Bowel sounds are increased. There is no distension.     Palpations: Abdomen is soft. There is no hepatomegaly or splenomegaly.      Tenderness: There is generalized abdominal tenderness.     Comments: Mild generalized abdominal tenderness.  No mass or organomegaly.  Musculoskeletal: Normal range of motion.  Skin:    General: Skin is warm and dry.  Neurological:     General: No focal deficit present.     Mental Status: She is alert.  Psychiatric:        Mood and Affect: Mood normal.        Behavior: Behavior normal.      UC Treatments / Results  Labs (all labs ordered are listed, but only abnormal results are displayed) Labs Reviewed  CBC - Abnormal; Notable for the following components:      Result Value   RBC 5.12 (*)    All other components within normal limits  BASIC METABOLIC PANEL - Abnormal; Notable for the following components:   Sodium 134 (*)    Potassium 2.6 (*)    CO2 16 (*)    Glucose, Bld 122 (*)    BUN 33 (*)    Creatinine, Ser 2.06 (*)    GFR calc non Af Amer 25 (*)    GFR calc Af Amer 29 (*)    All other components within normal limits  GASTROINTESTINAL PANEL BY PCR, STOOL (REPLACES STOOL CULTURE)    EKG   Radiology No results found.  Procedures Procedures (including critical care time)  Medications Ordered in UC Medications  sodium chloride 0.9 % bolus 1,000 mL (1,000 mLs Intravenous New Bag/Given 02/10/19 1003)    Initial Impression / Assessment and Plan / UC Course  I have reviewed the triage vital signs and the nursing notes.  Pertinent labs & imaging results that were available during my care of the patient were reviewed by me and considered in my medical decision making (see chart for details).  Clinical Course as of Feb 09 1118  Fri Feb 10, 2019  1751 Basic metabolic panel [YN]  0258 Basic metabolic panel [YN]    Clinical Course User Index [YN] Raylene Everts, MD    Discussed with the patient that she has some acute kidney injury from her dehydration.  She also has a very low potassium.  She needs to go the emergency room for IV fluids and repeat  laboratory.  She is hesitant, but explained that these are not services that I can provide  at the urgent care center. Final Clinical Impressions(s) / UC Diagnoses   Final diagnoses:  Diarrhea of infectious origin  Dehydration  AKI (acute kidney injury) (HCC)  Hypokalemia     Discharge Instructions     You need to be treated in the ER for the low potassium and dehydration and kidney s    ED Prescriptions    None     Controlled Substance Prescriptions [place] Controlled Substance Registry consulted? Not Applicable   Eustace MooreNelson, Mckynzi Cammon Sue, MD 02/10/19 1119

## 2019-02-10 NOTE — ED Provider Notes (Signed)
Care assumed from Dr. Melina Copa at end of shift, please see his note for full details, but in brief Jasmine Branch is a 63 y.o. female who presents from urgent care for evaluation of hypokalemia of 2.6 after having 4 days of persistent watery diarrhea, feeling generally weak, also noted to have new AKI on labs from urgent care. C difficile test was negative and coronavirus test negative, GI pathogen panel has already been ordered from urgent care.  Normal magnesium.  Patient wanted to avoid admission so 2 rounds of IV potassium as well as 40 of p.o. potassium given, and 1.5 L fluid bolus.  Repeat BMP pending, if improvement in potassium and AKI can be discharged home with p.o. potassium and outpatient follow-up.  Physical Exam  BP 105/63   Pulse 86   Temp 97.7 F (36.5 C) (Oral)   Resp 19   Ht 5\' 9"  (1.753 m)   Wt 88.5 kg   SpO2 97%   BMI 28.80 kg/m   Physical Exam Vitals signs and nursing note reviewed.  Constitutional:      General: She is not in acute distress.    Appearance: Normal appearance. She is well-developed and normal weight. She is not diaphoretic.  HENT:     Head: Normocephalic and atraumatic.  Eyes:     General:        Right eye: No discharge.        Left eye: No discharge.  Pulmonary:     Effort: Pulmonary effort is normal. No respiratory distress.  Abdominal:     General: Abdomen is flat.     Palpations: Abdomen is soft.     Comments: Abdomen soft and nondistended, hyperactive bowel sounds, no focal tenderness or peritoneal signs.  Neurological:     Mental Status: She is alert.     Coordination: Coordination normal.  Psychiatric:        Mood and Affect: Mood normal.        Behavior: Behavior normal.     ED Course/Procedures   Labs Reviewed  COMPREHENSIVE METABOLIC PANEL - Abnormal; Notable for the following components:      Result Value   Potassium 2.6 (*)    CO2 17 (*)    Glucose, Bld 124 (*)    BUN 36 (*)    Creatinine, Ser 2.00 (*)    GFR  calc non Af Amer 26 (*)    GFR calc Af Amer 30 (*)    All other components within normal limits  BASIC METABOLIC PANEL - Abnormal; Notable for the following components:   Potassium 2.7 (*)    CO2 16 (*)    Glucose, Bld 132 (*)    BUN 31 (*)    Creatinine, Ser 1.59 (*)    Calcium 8.8 (*)    GFR calc non Af Amer 34 (*)    GFR calc Af Amer 40 (*)    All other components within normal limits  C DIFFICILE QUICK SCREEN W PCR REFLEX  SARS CORONAVIRUS 2 (HOSPITAL ORDER, [place] [facility])  MAGNESIUM    Procedures  MDM   Repeat BMP with minimal improvement in potassium, now 2.7, CO2 16, and creatinine still elevated at 1.69.  Discussed this with patient, she is still having numerous episodes of diarrhea and continues to feel weak and fatigued.  Feel she would benefit from admission and patient is in agreement.  Will consult hospitalist.  Case discussed with Dr. Roel Cluck with [facility] hospitalist who will see  and admit the patient, after discussion would like to get Non-con CT of the abdomen and pelvis to look for potential cause for diarrhea.  Final diagnoses:  Diarrhea, unspecified type  Hypokalemia  Dehydration  AKI (acute kidney injury) [facility](HCC)        Dartha LodgeFord, Bannie Lobban N, PA-C 02/10/19 2125    Terrilee FilesButler, Michael C, MD 02/11/19 [phone]

## 2019-02-11 ENCOUNTER — Observation Stay (HOSPITAL_COMMUNITY): Payer: Self-pay

## 2019-02-11 LAB — GASTROINTESTINAL PANEL BY PCR, STOOL (REPLACES STOOL CULTURE)

## 2019-02-11 LAB — CBC
HCT: 35.5 % — ABNORMAL LOW (ref 36.0–46.0)
Hemoglobin: 11.9 g/dL — ABNORMAL LOW (ref 12.0–15.0)
MCH: 28.3 pg (ref 26.0–34.0)
MCHC: 33.5 g/dL (ref 30.0–36.0)
MCV: 84.5 fL (ref 80.0–100.0)
Platelets: 251 10*3/uL (ref 150–400)
RBC: 4.2 MIL/uL (ref 3.87–5.11)
RDW: 13.8 % (ref 11.5–15.5)
WBC: 4.6 10*3/uL (ref 4.0–10.5)
nRBC: 0 % (ref 0.0–0.2)

## 2019-02-11 LAB — COMPREHENSIVE METABOLIC PANEL
ALT: 24 U/L (ref 0–44)
AST: 21 U/L (ref 15–41)
Albumin: 3.1 g/dL — ABNORMAL LOW (ref 3.5–5.0)
Alkaline Phosphatase: 51 U/L (ref 38–126)
Anion gap: 9 (ref 5–15)
BUN: 23 mg/dL (ref 8–23)
CO2: 16 mmol/L — ABNORMAL LOW (ref 22–32)
Calcium: 8.7 mg/dL — ABNORMAL LOW (ref 8.9–10.3)
Chloride: 112 mmol/L — ABNORMAL HIGH (ref 98–111)
Creatinine, Ser: 1.23 mg/dL — ABNORMAL HIGH (ref 0.44–1.00)
GFR calc Af Amer: 54 mL/min — ABNORMAL LOW (ref >60)
GFR calc non Af Amer: 47 mL/min — ABNORMAL LOW (ref >60)
Glucose, Bld: 119 mg/dL — ABNORMAL HIGH (ref 70–99)
Potassium: 2.9 mmol/L — ABNORMAL LOW (ref 3.5–5.1)
Sodium: 137 mmol/L (ref 135–145)
Total Bilirubin: 0.6 mg/dL (ref 0.3–1.2)
Total Protein: 6.1 g/dL — ABNORMAL LOW (ref 6.5–8.1)

## 2019-02-11 LAB — CK: Total CK: 39 U/L (ref 38–234)

## 2019-02-11 LAB — PHOSPHORUS
Phosphorus: 2.6 mg/dL (ref 2.5–4.6)
Phosphorus: 2 mg/dL — ABNORMAL LOW (ref 2.5–4.6)

## 2019-02-11 LAB — MAGNESIUM: Magnesium: 1.8 mg/dL (ref 1.7–2.4)

## 2019-02-11 LAB — TSH: TSH: 1.275 u[IU]/mL (ref 0.350–4.500)

## 2019-02-11 MED ORDER — ENOXAPARIN SODIUM 40 MG/0.4ML ~~LOC~~ SOLN
40.0000 mg | SUBCUTANEOUS | Status: DC
  Administered 2019-02-11 – 2019-02-12 (×2): 40 mg via SUBCUTANEOUS
  Filled 2019-02-11 (×2): qty 0.4

## 2019-02-11 MED ORDER — CIPROFLOXACIN IN D5W 400 MG/200ML IV SOLN
400.0000 mg | Freq: Two times a day (BID) | INTRAVENOUS | Status: DC
  Administered 2019-02-11 – 2019-02-13 (×4): 400 mg via INTRAVENOUS
  Filled 2019-02-11 (×4): qty 200

## 2019-02-11 MED ORDER — POTASSIUM CHLORIDE 10 MEQ/100ML IV SOLN
10.0000 meq | INTRAVENOUS | Status: AC
  Administered 2019-02-11 (×4): 10 meq via INTRAVENOUS
  Filled 2019-02-11 (×4): qty 100

## 2019-02-11 MED ORDER — SODIUM CHLORIDE 0.9 % IV SOLN
INTRAVENOUS | Status: DC
  Administered 2019-02-11 – 2019-02-13 (×5): via INTRAVENOUS

## 2019-02-11 NOTE — Plan of Care (Signed)
  Problem: Education: Goal: Knowledge of General Education information will improve Description Including pain rating scale, medication(s)/side effects and non-pharmacologic comfort measures Outcome: Progressing   

## 2019-02-11 NOTE — Progress Notes (Signed)
PROGRESS NOTE  Jasmine Branch WUJ:[id number] DOB: Nov 26, 1955 DOA: 02/10/2019 PCP: Patient, No Pcp Per  HPI/Recap of past 24 hours: HPI from Dr Mable Filloutova [name] is a 63 y.o. female with no significant medical history, presented with severe diarrhea for 4 days.  Patient describes diarrhea as pond water, is watery, brown, nonbloody with about 6-7 episodes per day.  Associated with some very mild days abdominal discomfort.  Denies any fever/chills, nausea/vomiting, chest pain, cough, shortness of breath. Went to urgent care her K was noted to be  2.6, Cr was up to 2.0 no hx of CKD at baseline. While at urgent care, C.diff was negative, Gi panel ordered, repeat blood work was done showing still persistent AKI and hypokalemia at which point she was sent to emergency department.  Patient cooks all her vegan meals, no sick contacts.  CT scan unremarkable.    Today, patient continues to have diarrhea, with some generalized weakness.  Denies any worsening abdominal pain, fever/chills, nausea/vomiting, chest pain.   Assessment/Plan: Active Problems:   Enteritis   Hypokalemia   Dehydration   AKI (acute kidney injury) (HCC)  Acute gastroenteritis Unknown etiology, ??  Infectious Afebrile, no leukocytosis C. difficile panel negative GI stool panel pending CT abdomen pelvis showed no evidence for enteritis, although limited by lack of IV contrast IV fluids We will hold off on starting any antibiotics pending GI panel  AKI Likely due to above IV fluids Daily BMP  Hypokalemia Likely due to severe diarrhea Replace PRN         Malnutrition Type:      Malnutrition Characteristics:      Nutrition Interventions:       Estimated body mass index is 29.46 kg/m as calculated from the following:   Height as of this encounter: 5\' 10"  (1.778 m).   Weight as of this encounter: 93.1 kg.     Code Status: Full  Family Communication: Discussed with patient at  bedside  Disposition Plan: Home   Consultants:  None  Procedures:  None  Antimicrobials:  None  DVT prophylaxis: Lovenox   Objective: Vitals:   02/10/19 1715 02/10/19 2252 02/11/19 0429 02/11/19 0940  BP: 105/63 121/73 118/74 121/70  Pulse: 86 80 75 74  Resp: 19 18 18 18   Temp:  98.4 F (36.9 C) 98.1 F (36.7 C) 97.7 F (36.5 C)  TempSrc:  Oral Oral Oral  SpO2: 97% 99% 98% 100%  Weight:  93.1 kg    Height:  5\' 10"  (1.778 m)      Intake/Output Summary (Last 24 hours) at 02/11/2019 1152 Last data filed at 02/11/2019 0900 Gross per 24 hour  Intake 1445.01 ml  Output 450 ml  Net 995.01 ml   Filed Weights   02/10/19 1250 02/10/19 2252  Weight: 88.5 kg 93.1 kg    Exam:  General: NAD   Cardiovascular: S1, S2 present  Respiratory: CTAB  Abdomen: Soft, mild generalized tenderness, nondistended, bowel sounds present  Musculoskeletal: No bilateral pedal edema noted  Skin: Normal  Psychiatry: Normal mood   Data Reviewed: CBC: Recent Labs  Lab 02/10/19 0942 02/11/19 0852  WBC 4.9 4.6  HGB 14.3 11.9*  HCT 42.9 35.5*  MCV 83.8 84.5  PLT 311 251   Basic Metabolic Panel: Recent Labs  Lab 02/10/19 0942 02/10/19 1231 02/10/19 1930 02/11/19 0852  NA 134* 136 136 137  K 2.6* 2.6* 2.7* 2.9*  CL 103 105 110 112*  CO2 16* 17* 16* 16*  GLUCOSE  122* 124* 132* 119*  BUN 33* 36* 31* 23  CREATININE 2.06* 2.00* 1.59* 1.23*  CALCIUM 9.7 9.0 8.8* 8.7*  MG  --  2.0  --  1.8  PHOS  --   --  2.6 2.0*   GFR: Estimated Creatinine Clearance: 58.6 mL/min (A) (by C-G formula based on SCr of 1.23 mg/dL (H)). Liver Function Tests: Recent Labs  Lab 02/10/19 1231 02/11/19 0852  AST 23 21  ALT 27 24  ALKPHOS 51 51  BILITOT 0.7 0.6  PROT 6.8 6.1*  ALBUMIN 3.6 3.1*   No results for input(s): LIPASE, AMYLASE in the last 168 hours. No results for input(s): AMMONIA in the last 168 hours. Coagulation Profile: No results for input(s): INR, PROTIME in the  last 168 hours. Cardiac Enzymes: Recent Labs  Lab 02/10/19 1930  CKTOTAL 39   BNP (last 3 results) No results for input(s): PROBNP in the last 8760 hours. HbA1C: No results for input(s): HGBA1C in the last 72 hours. CBG: No results for input(s): GLUCAP in the last 168 hours. Lipid Profile: No results for input(s): CHOL, HDL, LDLCALC, TRIG, CHOLHDL, LDLDIRECT in the last 72 hours. Thyroid Function Tests: Recent Labs    02/11/19 0852  TSH 1.275   Anemia Panel: No results for input(s): VITAMINB12, FOLATE, FERRITIN, TIBC, IRON, RETICCTPCT in the last 72 hours. Urine analysis:    Component Value Date/Time   COLORURINE YELLOW 11/30/2018 0321   APPEARANCEUR CLEAR 11/30/2018 0321   LABSPEC 1.017 11/30/2018 0321   PHURINE 5.0 11/30/2018 0321   GLUCOSEU NEGATIVE 11/30/2018 0321   HGBUR NEGATIVE 11/30/2018 0321   BILIRUBINUR NEGATIVE 11/30/2018 0321   KETONESUR NEGATIVE 11/30/2018 0321   PROTEINUR NEGATIVE 11/30/2018 0321   NITRITE NEGATIVE 11/30/2018 0321   LEUKOCYTESUR NEGATIVE 11/30/2018 0321   Sepsis Labs: @LABRCNTIP (procalcitonin:4,lacticidven:4)  ) Recent Results (from the past 240 hour(s))  SARS Coronavirus 2 (CEPHEID - Performed in [facility]), Hosp Order     Status: None   Collection Time: 02/10/19 12:52 PM   Specimen: Nasopharyngeal Swab  Result Value Ref Range Status   SARS Coronavirus 2 NEGATIVE NEGATIVE Final    Comment: (NOTE) If result is NEGATIVE SARS-CoV-2 target nucleic acids are NOT DETECTED. The SARS-CoV-2 RNA is generally detectable in upper and lower  respiratory specimens during the acute phase of infection. The lowest  concentration of SARS-CoV-2 viral copies this assay can detect is 250  copies / mL. A negative result does not preclude SARS-CoV-2 infection  and should not be used as the sole basis for treatment or other  patient management decisions.  A negative result may occur with  improper specimen collection / handling,  submission of specimen other  than nasopharyngeal swab, presence of viral mutation(s) within the  areas targeted by this assay, and inadequate number of viral copies  (<250 copies / mL). A negative result must be combined with clinical  observations, patient history, and epidemiological information. If result is POSITIVE SARS-CoV-2 target nucleic acids are DETECTED. The SARS-CoV-2 RNA is generally detectable in upper and lower  respiratory specimens dur ing the acute phase of infection.  Positive  results are indicative of active infection with SARS-CoV-2.  Clinical  correlation with patient history and other diagnostic information is  necessary to determine patient infection status.  Positive results do  not rule out bacterial infection or co-infection with other viruses. If result is PRESUMPTIVE POSTIVE SARS-CoV-2 nucleic acids MAY BE PRESENT.   A presumptive positive result was obtained on the  submitted specimen  and confirmed on repeat testing.  While 2019 novel coronavirus  (SARS-CoV-2) nucleic acids may be present in the submitted sample  additional confirmatory testing may be necessary for epidemiological  and / or clinical management purposes  to differentiate between  SARS-CoV-2 and other Sarbecovirus currently known to infect humans.  If clinically indicated additional testing with an alternate test  methodology [phone]) is advised. The SARS-CoV-2 RNA is generally  detectable in upper and lower respiratory sp ecimens during the acute  phase of infection. The expected result is Negative. Fact Sheet for Patients:  StrictlyIdeas.no Fact Sheet for Healthcare Providers: BankingDealers.co.za This test is not yet approved or cleared by the [place] FDA and has been authorized for detection and/or diagnosis of SARS-CoV-2 by FDA under an Emergency Use Authorization (EUA).  This EUA will remain in effect (meaning this test can be  used) for the duration of the COVID-19 declaration under Section 564(b)(1) of the Act, 21 U.S.C. section 360bbb-3(b)(1), unless the authorization is terminated or revoked sooner. Performed at [facility], [place] [address], [place], [place] [address]   C difficile quick scan w PCR reflex     Status: None   Collection Time: 02/10/19  3:45 PM   Specimen: STOOL  Result Value Ref Range Status   C Diff antigen NEGATIVE NEGATIVE Final   C Diff toxin NEGATIVE NEGATIVE Final   C Diff interpretation No C. difficile detected.  Final    Comment: NEGATIVE Performed at [facility], [place] [address] [place], [place] [address]       Studies: Ct Abdomen Pelvis Wo Contrast  Result Date: 02/11/2019 CLINICAL DATA:  Persistent diarrhea. EXAM: CT ABDOMEN AND PELVIS WITHOUT CONTRAST TECHNIQUE: Multidetector CT imaging of the abdomen and pelvis was performed following the standard protocol without IV contrast. COMPARISON:  Nov 30, 2018 FINDINGS: Lower chest: The lung bases are clear. The heart size is normal. Hepatobiliary: There is decreased hepatic attenuation suggestive of hepatic steatosis. Status post cholecystectomy.There is no biliary ductal dilation. Pancreas: Normal contours without ductal dilatation. No peripancreatic fluid collection. Spleen: No splenic laceration or hematoma. Adrenals/Urinary Tract: --Adrenal glands: No adrenal hemorrhage. --Right kidney/ureter: No hydronephrosis or perinephric hematoma. --Left kidney/ureter: No hydronephrosis or perinephric hematoma. --Urinary bladder: Unremarkable. Stomach/Bowel: --Stomach/Duodenum: No hiatal hernia or other gastric abnormality. Normal duodenal course and caliber. --Small bowel: No dilatation or inflammation. --Colon: There is liquid stool in the colon. There are few scattered colonic diverticula without CT evidence of diverticulitis. --Appendix: Normal. Vascular/Lymphatic: Atherosclerotic calcification is present within the non-aneurysmal  abdominal aorta, without hemodynamically significant stenosis. --No retroperitoneal lymphadenopathy. --No mesenteric lymphadenopathy. --No pelvic or inguinal lymphadenopathy. Reproductive: There is a fibroid uterus. Other: No ascites or free air. The abdominal wall is normal. Musculoskeletal. No acute displaced fractures. IMPRESSION: 1. Liquid stool consistent with diarrhea. 2. Hepatic steatosis. 3. Fibroid uterus. 4. Normal appendix in the right lower quadrant. No CT evidence for enteritis, however evaluation is limited by lack of IV contrast. Electronically Signed   By: Constance Holster M.D.   On: 02/11/2019 01:11    Scheduled Meds:  Continuous Infusions:  potassium chloride       LOS: 0 days     Alma Friendly, MD [facility]  If 7PM-7AM, please contact night-coverage [email or web address] 02/11/2019, 11:52 AM

## 2019-02-11 NOTE — Progress Notes (Signed)
Pharmacy Antibiotic Note  Liliann File is a 63 y.o. female admitted on 02/10/2019 with Salmonella gastroenteritis.  Pharmacy has been consulted for Cipro dosing.  Plan: Cipro 400 mg IV q 12 hrs.  F/u ability to switch to po F/u length of therapy.  Height: 5\' 10"  (177.8 cm) Weight: 205 lb 4.8 oz (93.1 kg) IBW/kg (Calculated) : 68.5  Temp (24hrs), Avg:98 F (36.7 C), Min:97.7 F (36.5 C), Max:98.4 F (36.9 C)  Recent Labs  Lab 02/10/19 0942 02/10/19 1231 02/10/19 1930 02/11/19 0852  WBC 4.9  --   --  4.6  CREATININE 2.06* 2.00* 1.59* 1.23*    Estimated Creatinine Clearance: 58.6 mL/min (A) (by C-G formula based on SCr of 1.23 mg/dL (H)).    No Known Allergies  Antimicrobials this admission: Cipro 7/25 >  Dose adjustments this admission:  Microbiology results:  Thank you for allowing pharmacy to be a part of this patient's care.  Marguerite Olea, [facility] Clinical Pharmacist Phone [phone]  02/11/2019 7:02 PM

## 2019-02-12 DIAGNOSIS — A09 Infectious gastroenteritis and colitis, unspecified: Secondary | ICD-10-CM

## 2019-02-12 LAB — CBC WITH DIFFERENTIAL/PLATELET
Abs Immature Granulocytes: 0.09 10*3/uL — ABNORMAL HIGH (ref 0.00–0.07)
Basophils Absolute: 0.1 10*3/uL (ref 0.0–0.1)
Basophils Relative: 1 %
Eosinophils Absolute: 0.1 10*3/uL (ref 0.0–0.5)
Eosinophils Relative: 2 %
HCT: 38.6 % (ref 36.0–46.0)
Hemoglobin: 12.8 g/dL (ref 12.0–15.0)
Immature Granulocytes: 2 %
Lymphocytes Relative: 44 %
Lymphs Abs: 2.3 10*3/uL (ref 0.7–4.0)
MCH: 28.1 pg (ref 26.0–34.0)
MCHC: 33.2 g/dL (ref 30.0–36.0)
MCV: 84.6 fL (ref 80.0–100.0)
Monocytes Absolute: 0.8 10*3/uL (ref 0.1–1.0)
Monocytes Relative: 15 %
Neutro Abs: 1.8 10*3/uL (ref 1.7–7.7)
Neutrophils Relative %: 36 %
Platelets: 274 10*3/uL (ref 150–400)
RBC: 4.56 MIL/uL (ref 3.87–5.11)
RDW: 13.9 % (ref 11.5–15.5)
WBC: 5.1 10*3/uL (ref 4.0–10.5)
nRBC: 0 % (ref 0.0–0.2)

## 2019-02-12 LAB — BASIC METABOLIC PANEL
Anion gap: 8 (ref 5–15)
BUN: 12 mg/dL (ref 8–23)
CO2: 16 mmol/L — ABNORMAL LOW (ref 22–32)
Calcium: 8.8 mg/dL — ABNORMAL LOW (ref 8.9–10.3)
Chloride: 114 mmol/L — ABNORMAL HIGH (ref 98–111)
Creatinine, Ser: 1.08 mg/dL — ABNORMAL HIGH (ref 0.44–1.00)
GFR calc Af Amer: >60 mL/min (ref >60)
GFR calc non Af Amer: 55 mL/min — ABNORMAL LOW (ref >60)
Glucose, Bld: 124 mg/dL — ABNORMAL HIGH (ref 70–99)
Potassium: 2.8 mmol/L — ABNORMAL LOW (ref 3.5–5.1)
Sodium: 138 mmol/L (ref 135–145)

## 2019-02-12 LAB — HIV ANTIBODY (ROUTINE TESTING W REFLEX): HIV Screen 4th Generation wRfx: NONREACTIVE

## 2019-02-12 MED ORDER — POTASSIUM CHLORIDE 10 MEQ/100ML IV SOLN
10.0000 meq | INTRAVENOUS | Status: AC
  Administered 2019-02-12 (×4): 10 meq via INTRAVENOUS
  Filled 2019-02-12 (×4): qty 100

## 2019-02-12 MED ORDER — POTASSIUM CHLORIDE CRYS ER 20 MEQ PO TBCR
40.0000 meq | EXTENDED_RELEASE_TABLET | Freq: Once | ORAL | Status: AC
  Administered 2019-02-12: 40 meq via ORAL
  Filled 2019-02-12: qty 2

## 2019-02-12 NOTE — Plan of Care (Signed)
  Problem: Health Behavior/Discharge Planning: Goal: Ability to manage health-related needs will improve Outcome: Progressing   Problem: Clinical Measurements: Goal: Ability to maintain clinical measurements within normal limits will improve Outcome: Progressing   

## 2019-02-12 NOTE — Progress Notes (Signed)
PROGRESS NOTE  Jasmine Branch [id number] DOB: [birth year] DOA: 02/10/2019 PCP: Patient, No Pcp Per  HPI/Recap of past 24 hours: HPI from Dr Mable Filloutova Jasmine Branch is a 63 y.o. female with no significant medical history, presented with severe diarrhea for 4 days.  Patient describes diarrhea as pond water, is watery, brown, nonbloody with about 6-7 episodes per day.  Associated with some very mild days abdominal discomfort.  Denies any fever/chills, nausea/vomiting, chest pain, cough, shortness of breath. Went to urgent care her K was noted to be  2.6, Cr was up to 2.0 no hx of CKD at baseline. While at urgent care, C.diff was negative, Gi panel ordered, repeat blood work was done showing still persistent AKI and hypokalemia at which point she was sent to emergency department.  Patient cooks all her vegan meals, no sick contacts.  CT scan unremarkable.    Today, patient continues to have diarrhea, denies any worsening abdominal cramps, nausea/vomiting, denies any fever/chills, shortness of breath, chest pain.   Assessment/Plan: Active Problems:   Enteritis   Hypokalemia   Dehydration   AKI (acute kidney injury) (HCC)  Acute infectious gastroenteritis 2/2 Salmonella Afebrile, no leukocytosis C. difficile panel negative GI stool panel positive for Salmonella CT abdomen pelvis showed no evidence for enteritis, although limited by lack of IV contrast IV fluids Continue ciprofloxacin, for a total of 5 days Advance diet  AKI Likely due to above IV fluids Daily BMP  Hypokalemia Likely due to severe diarrhea Replace PRN         Malnutrition Type:      Malnutrition Characteristics:      Nutrition Interventions:       Estimated body mass index is 29.45 kg/m as calculated from the following:   Height as of this encounter: 5\' 10"  (1.778 m).   Weight as of this encounter: 93.1 kg.     Code Status: Full  Family Communication: Discussed with patient  at bedside  Disposition Plan: Home   Consultants:  None  Procedures:  None  Antimicrobials:  None  DVT prophylaxis: Lovenox   Objective: Vitals:   02/11/19 2124 02/12/19 0619 02/12/19 0624 02/12/19 0903  BP: 109/61  111/80 111/63  Pulse: 73  71 76  Resp: 16  16 18   Temp: 98.2 F (36.8 C)  98.3 F (36.8 C) 98.1 F (36.7 C)  TempSrc: Oral  Oral Oral  SpO2: 98%  98% 98%  Weight:  93.1 kg    Height:        Intake/Output Summary (Last 24 hours) at 02/12/2019 1559 Last data filed at 02/12/2019 0948 Gross per 24 hour  Intake 2961.31 ml  Output 0 ml  Net 2961.31 ml   Filed Weights   02/10/19 1250 02/10/19 2252 02/12/19 0619  Weight: 88.5 kg 93.1 kg 93.1 kg    Exam:  General: NAD   Cardiovascular: S1, S2 present  Respiratory: CTAB  Abdomen: Soft, nontender, nondistended, bowel sounds present  Musculoskeletal: No bilateral pedal edema noted  Skin: Normal  Psychiatry: Normal mood   Data Reviewed: CBC: Recent Labs  Lab 02/10/19 0942 02/11/19 0852 02/12/19 0755  WBC 4.9 4.6 5.1  NEUTROABS  --   --  1.8  HGB 14.3 11.9* 12.8  HCT 42.9 35.5* 38.6  MCV 83.8 84.5 84.6  PLT 311 251 274   Basic Metabolic Panel: Recent Labs  Lab 02/10/19 0942 02/10/19 1231 02/10/19 1930 02/11/19 0852 02/12/19 0755  NA 134* 136 136 137 138  K 2.6*  2.6* 2.7* 2.9* 2.8*  CL 103 105 110 112* 114*  CO2 16* 17* 16* 16* 16*  GLUCOSE 122* 124* 132* 119* 124*  BUN 33* 36* 31* 23 12  CREATININE 2.06* 2.00* 1.59* 1.23* 1.08*  CALCIUM 9.7 9.0 8.8* 8.7* 8.8*  MG  --  2.0  --  1.8  --   PHOS  --   --  2.6 2.0*  --    GFR: Estimated Creatinine Clearance: 66.8 mL/min (A) (by C-G formula based on SCr of 1.08 mg/dL (H)). Liver Function Tests: Recent Labs  Lab 02/10/19 1231 02/11/19 0852  AST 23 21  ALT 27 24  ALKPHOS 51 51  BILITOT 0.7 0.6  PROT 6.8 6.1*  ALBUMIN 3.6 3.1*   No results for input(s): LIPASE, AMYLASE in the last 168 hours. No results for input(s):  AMMONIA in the last 168 hours. Coagulation Profile: No results for input(s): INR, PROTIME in the last 168 hours. Cardiac Enzymes: Recent Labs  Lab 02/10/19 1930  CKTOTAL 39   BNP (last 3 results) No results for input(s): PROBNP in the last 8760 hours. HbA1C: No results for input(s): HGBA1C in the last 72 hours. CBG: No results for input(s): GLUCAP in the last 168 hours. Lipid Profile: No results for input(s): CHOL, HDL, LDLCALC, TRIG, CHOLHDL, LDLDIRECT in the last 72 hours. Thyroid Function Tests: Recent Labs    02/11/19 0852  TSH 1.275   Anemia Panel: No results for input(s): VITAMINB12, FOLATE, FERRITIN, TIBC, IRON, RETICCTPCT in the last 72 hours. Urine analysis:    Component Value Date/Time   COLORURINE YELLOW 11/30/2018 0321   APPEARANCEUR CLEAR 11/30/2018 0321   LABSPEC 1.017 11/30/2018 0321   PHURINE 5.0 11/30/2018 0321   GLUCOSEU NEGATIVE 11/30/2018 0321   HGBUR NEGATIVE 11/30/2018 0321   BILIRUBINUR NEGATIVE 11/30/2018 0321   KETONESUR NEGATIVE 11/30/2018 0321   PROTEINUR NEGATIVE 11/30/2018 0321   NITRITE NEGATIVE 11/30/2018 0321   LEUKOCYTESUR NEGATIVE 11/30/2018 0321   Sepsis Labs: @LABRCNTIP (procalcitonin:4,lacticidven:4)  ) Recent Results (from the past 240 hour(s))  Gastrointestinal Panel by PCR , Stool     Status: Abnormal   Collection Time: 02/10/19 11:10 AM   Specimen: Stool  Result Value Ref Range Status   Campylobacter species NOT DETECTED NOT DETECTED Final   Plesimonas shigelloides NOT DETECTED NOT DETECTED Final   Salmonella species DETECTED (A) NOT DETECTED Final    Comment: RESULT CALLED TO, READ BACK BY AND VERIFIED WITH: JANELLE LAMBERANG @1716  02/11/19 MJU    Yersinia enterocolitica NOT DETECTED NOT DETECTED Final   Vibrio species NOT DETECTED NOT DETECTED Final   Vibrio cholerae NOT DETECTED NOT DETECTED Final   Enteroaggregative E coli (EAEC) NOT DETECTED NOT DETECTED Final   Enteropathogenic E coli (EPEC) NOT DETECTED NOT  DETECTED Final   Enterotoxigenic E coli (ETEC) NOT DETECTED NOT DETECTED Final   Shiga like toxin producing E coli (STEC) NOT DETECTED NOT DETECTED Final   Shigella/Enteroinvasive E coli (EIEC) NOT DETECTED NOT DETECTED Final   Cryptosporidium NOT DETECTED NOT DETECTED Final   Cyclospora cayetanensis NOT DETECTED NOT DETECTED Final   Entamoeba histolytica NOT DETECTED NOT DETECTED Final   Giardia lamblia NOT DETECTED NOT DETECTED Final   Adenovirus F40/41 NOT DETECTED NOT DETECTED Final   Astrovirus NOT DETECTED NOT DETECTED Final   Norovirus GI/GII NOT DETECTED NOT DETECTED Final   Rotavirus A NOT DETECTED NOT DETECTED Final   Sapovirus (I, II, IV, and V) NOT DETECTED NOT DETECTED Final    Comment: Performed at  [facility], [address], [place], [place] [address]  SARS Coronavirus 2 (CEPHEID - Performed in [facility] hospital lab), Hosp Order     Status: None   Collection Time: 02/10/19 12:52 PM   Specimen: Nasopharyngeal Swab  Result Value Ref Range Status   SARS Coronavirus 2 NEGATIVE NEGATIVE Final    Comment: (NOTE) If result is NEGATIVE SARS-CoV-2 target nucleic acids are NOT DETECTED. The SARS-CoV-2 RNA is generally detectable in upper and lower  respiratory specimens during the acute phase of infection. The lowest  concentration of SARS-CoV-2 viral copies this assay can detect is 250  copies / mL. A negative result does not preclude SARS-CoV-2 infection  and should not be used as the sole basis for treatment or other  patient management decisions.  A negative result may occur with  improper specimen collection / handling, submission of specimen other  than nasopharyngeal swab, presence of viral mutation(s) within the  areas targeted by this assay, and inadequate number of viral copies  (<250 copies / mL). A negative result must be combined with clinical  observations, patient history, and epidemiological information. If result is POSITIVE SARS-CoV-2 target  nucleic acids are DETECTED. The SARS-CoV-2 RNA is generally detectable in upper and lower  respiratory specimens dur ing the acute phase of infection.  Positive  results are indicative of active infection with SARS-CoV-2.  Clinical  correlation with patient history and other diagnostic information is  necessary to determine patient infection status.  Positive results do  not rule out bacterial infection or co-infection with other viruses. If result is PRESUMPTIVE POSTIVE SARS-CoV-2 nucleic acids MAY BE PRESENT.   A presumptive positive result was obtained on the submitted specimen  and confirmed on repeat testing.  While 2019 novel coronavirus  (SARS-CoV-2) nucleic acids may be present in the submitted sample  additional confirmatory testing may be necessary for epidemiological  and / or clinical management purposes  to differentiate between  SARS-CoV-2 and other Sarbecovirus currently known to infect humans.  If clinically indicated additional testing with an alternate test  methodology [phone]) is advised. The SARS-CoV-2 RNA is generally  detectable in upper and lower respiratory sp ecimens during the acute  phase of infection. The expected result is Negative. Fact Sheet for Patients:  StrictlyIdeas.no Fact Sheet for Healthcare Providers: BankingDealers.co.za This test is not yet approved or cleared by the [place] FDA and has been authorized for detection and/or diagnosis of SARS-CoV-2 by FDA under an Emergency Use Authorization (EUA).  This EUA will remain in effect (meaning this test can be used) for the duration of the COVID-19 declaration under Section 564(b)(1) of the Act, 21 U.S.C. section 360bbb-3(b)(1), unless the authorization is terminated or revoked sooner. Performed at [facility], [place] [address] [place], [place] [address]   C difficile quick scan w PCR reflex     Status: None   Collection Time: 02/10/19   3:45 PM   Specimen: STOOL  Result Value Ref Range Status   C Diff antigen NEGATIVE NEGATIVE Final   C Diff toxin NEGATIVE NEGATIVE Final   C Diff interpretation No C. difficile detected.  Final    Comment: NEGATIVE Performed at [facility], [place] [address], [place], [place] [address]       Studies: No results found.  Scheduled Meds: . enoxaparin (LOVENOX) injection  40 mg Subcutaneous Q24H  . potassium chloride  40 mEq Oral Once    Continuous Infusions: . sodium chloride 100 mL/hr  at 02/12/19 0629  . ciprofloxacin 400 mg (02/12/19 0759)     LOS: 1 day     Briant CedarNkeiruka J Shonda Mandarino, MD [facility]  If 7PM-7AM, please contact night-coverage [email or web address] 02/12/2019, 3:59 PM

## 2019-02-13 DIAGNOSIS — A020 Salmonella enteritis: Principal | ICD-10-CM

## 2019-02-13 LAB — BASIC METABOLIC PANEL
Anion gap: 9 (ref 5–15)
BUN: 7 mg/dL — ABNORMAL LOW (ref 8–23)
CO2: 17 mmol/L — ABNORMAL LOW (ref 22–32)
Calcium: 8.7 mg/dL — ABNORMAL LOW (ref 8.9–10.3)
Chloride: 116 mmol/L — ABNORMAL HIGH (ref 98–111)
Creatinine, Ser: 0.83 mg/dL (ref 0.44–1.00)
GFR calc Af Amer: >60 mL/min (ref >60)
GFR calc non Af Amer: >60 mL/min (ref >60)
Glucose, Bld: 102 mg/dL — ABNORMAL HIGH (ref 70–99)
Potassium: 3 mmol/L — ABNORMAL LOW (ref 3.5–5.1)
Sodium: 142 mmol/L (ref 135–145)

## 2019-02-13 MED ORDER — CIPROFLOXACIN HCL 500 MG PO TABS: 500.0000 mg | ORAL_TABLET | Freq: Two times a day (BID) | ORAL | 0 refills | Status: AC

## 2019-02-13 MED ORDER — POTASSIUM CHLORIDE CRYS ER 20 MEQ PO TBCR
40.0000 meq | EXTENDED_RELEASE_TABLET | Freq: Once | ORAL | Status: AC
  Administered 2019-02-13: 40 meq via ORAL
  Filled 2019-02-13: qty 2

## 2019-02-13 MED ORDER — POTASSIUM CHLORIDE CRYS ER 20 MEQ PO TBCR: 20.0000 meq | EXTENDED_RELEASE_TABLET | Freq: Two times a day (BID) | ORAL | 0 refills | Status: AC

## 2019-02-13 NOTE — Discharge Summary (Signed)
Discharge Summary  Jasmine Branch JYN:[id number] DOB: Dec 11, 1955  PCP: Patient, No Pcp Per  Admit date: 02/10/2019 Discharge date: 02/13/2019  Time spent: 40 mins  Recommendations for Outpatient Follow-up:  1. Follow up with [facility] in 1 week, contact info given to patient to make an appointment as she is uninsured  Discharge Diagnoses:  Active Hospital Problems   Diagnosis Date Noted   Enteritis 02/10/2019   Hypokalemia 02/10/2019   Dehydration 02/10/2019   AKI (acute kidney injury) (HCC) 02/10/2019    Resolved Hospital Problems  No resolved problems to display.    Discharge Condition: Stable   Diet recommendation: Continue her vegan diet  Vitals:   02/13/19 0619 02/13/19 0814  BP: 131/77 130/76  Pulse: 74 77  Resp: 20 18  Temp: 97.6 F (36.4 C) 98.1 F (36.7 C)  SpO2: 99% 99%    History of present illness:  Jasmine Chestnutamara Jo Blackwellis a 63 y.o.femalewith nosignificant medical history, presented with severe diarrhea for 4 days.  Patient describes diarrhea as pond water, is watery, brown, nonbloody with about 6-7 episodes per day.  Associated with some very mild days abdominal discomfort.  Denies any fever/chills, nausea/vomiting, chest pain, cough, shortness of breath. Went to urgent care her K wasnoted to be2.6, Cr was up to 2.0no hx of CKD at baseline. While at urgent care, C.diff was negative, Gi panel ordered, repeat blood work was done showing still persistent AKI and hypokalemia at which point she was sent to emergency department.  Patient cooks all her vegan meals, no sick contacts.  CT scan unremarkable.    Today, pt reported feeling better, still with diarrhea but improving. Denies any abdominal pain, N/V, fever/chills, chest pain, SOB. Pt [facility] with repeat labs in 1 week. Pt advised to observe strict hand hygiene, wash fruit and veggies thoroughly, and cook meat/poultry thoroughly.   Hospital Course:    Active Problems:   Enteritis   Hypokalemia   Dehydration   AKI (acute kidney injury) (HCC)  Acute infectious gastroenteritis 2/2 Salmonella Afebrile, no leukocytosis C. difficile panel negative GI stool panel positive for Salmonella CT abdomen pelvis showed no evidence for enteritis Continue ciprofloxacin, for a total of 5 days Tolerating advanced diet  AKI Likely due to above Follow up with PCP with repeat labs, advised to stay hydrated  Hypokalemia Likely due to severe diarrhea D/C with potassium supplements         Malnutrition Type:      Malnutrition Characteristics:      Nutrition Interventions:      Estimated body mass index is 29.45 kg/m as calculated from the following:   Height as of this encounter: 5\' 10"  (1.778 m).   Weight as of this encounter: 93.1 kg.    Procedures:  None  Consultations:  None  Discharge Exam: BP 130/76 (BP Location: Left Arm)    Pulse 77    Temp 98.1 F (36.7 C) (Oral)    Resp 18    Ht 5\' 10"  (1.778 m)    Wt 93.1 kg    SpO2 99%    BMI 29.45 kg/m   General: NAD Cardiovascular: S1, S2 present Respiratory: CTAB  Discharge Instructions You were cared for by a hospitalist during your hospital stay. If you have any questions about your discharge medications or the care you received while you were in the hospital after you are discharged, you can call the unit and asked to speak with the hospitalist on  call if the hospitalist that took care of you is not available. Once you are discharged, your primary care physician will handle any further medical issues. Please note that NO REFILLS for any discharge medications will be authorized once you are discharged, as it is imperative that you return to your primary care physician (or establish a relationship with a primary care physician if you do not have one) for your aftercare needs so that they can reassess your need for medications and monitor your lab values.   Allergies  as of 02/13/2019   No Known Allergies     Medication List    TAKE these medications   ciprofloxacin 500 MG tablet Commonly known as: Cipro Take 1 tablet (500 mg total) by mouth 2 (two) times daily for 3 days.   MAGNESIUM PO Take 1 tablet by mouth daily.   OVER THE COUNTER MEDICATION Take 1 tablet by mouth daily. "blood builder" from [place] Take 1 tablet by mouth daily. "wheat grass" from [employer]   potassium chloride SA 20 MEQ tablet Commonly known as: K-DUR Take 1 tablet (20 mEq total) by mouth 2 (two) times daily.   SPIRULINA PO Take 1 tablet by mouth daily.   VITAMIN B-12 PO Take 1 tablet by mouth daily.   VITAMIN C PO Take 1 tablet by mouth daily.      No Known Allergies Follow-up Information    [facility]. Schedule an appointment as soon as possible for a visit in 1 week(s).   Why: Please call office for appointment and follow up Contact information: [address] [place] [phone]           The results of significant diagnostics from this hospitalization (including imaging, microbiology, ancillary and laboratory) are listed below for reference.    Significant Diagnostic Studies: Ct Abdomen Pelvis Wo Contrast  Result Date: 02/11/2019 CLINICAL DATA:  Persistent diarrhea. EXAM: CT ABDOMEN AND PELVIS WITHOUT CONTRAST TECHNIQUE: Multidetector CT imaging of the abdomen and pelvis was performed following the standard protocol without IV contrast. COMPARISON:  Nov 30, 2018 FINDINGS: Lower chest: The lung bases are clear. The heart size is normal. Hepatobiliary: There is decreased hepatic attenuation suggestive of hepatic steatosis. Status post cholecystectomy.There is no biliary ductal dilation. Pancreas: Normal contours without ductal dilatation. No peripancreatic fluid collection. Spleen: No splenic laceration or hematoma. Adrenals/Urinary Tract: --Adrenal  glands: No adrenal hemorrhage. --Right kidney/ureter: No hydronephrosis or perinephric hematoma. --Left kidney/ureter: No hydronephrosis or perinephric hematoma. --Urinary bladder: Unremarkable. Stomach/Bowel: --Stomach/Duodenum: No hiatal hernia or other gastric abnormality. Normal duodenal course and caliber. --Small bowel: No dilatation or inflammation. --Colon: There is liquid stool in the colon. There are few scattered colonic diverticula without CT evidence of diverticulitis. --Appendix: Normal. Vascular/Lymphatic: Atherosclerotic calcification is present within the non-aneurysmal abdominal aorta, without hemodynamically significant stenosis. --No retroperitoneal lymphadenopathy. --No mesenteric lymphadenopathy. --No pelvic or inguinal lymphadenopathy. Reproductive: There is a fibroid uterus. Other: No ascites or free air. The abdominal wall is normal. Musculoskeletal. No acute displaced fractures. IMPRESSION: 1. Liquid stool consistent with diarrhea. 2. Hepatic steatosis. 3. Fibroid uterus. 4. Normal appendix in the right lower quadrant. No CT evidence for enteritis, however evaluation is limited by lack of IV contrast. Electronically Signed   By: Constance Holster M.D.   On: 02/11/2019 01:11    Microbiology: Recent Results (from the past 240 hour(s))  Gastrointestinal Panel by PCR , Stool  Status: Abnormal   Collection Time: 02/10/19 11:10 AM   Specimen: Stool  Result Value Ref Range Status   Campylobacter species NOT DETECTED NOT DETECTED Final   Plesimonas shigelloides NOT DETECTED NOT DETECTED Final   Salmonella species DETECTED (A) NOT DETECTED Final    Comment: RESULT CALLED TO, READ BACK BY AND VERIFIED WITH: JANELLE LAMBERANG @1716  02/11/19 MJU    Yersinia enterocolitica NOT DETECTED NOT DETECTED Final   Vibrio species NOT DETECTED NOT DETECTED Final   Vibrio cholerae NOT DETECTED NOT DETECTED Final   Enteroaggregative E coli (EAEC) NOT DETECTED NOT DETECTED Final    Enteropathogenic E coli (EPEC) NOT DETECTED NOT DETECTED Final   Enterotoxigenic E coli (ETEC) NOT DETECTED NOT DETECTED Final   Shiga like toxin producing E coli (STEC) NOT DETECTED NOT DETECTED Final   Shigella/Enteroinvasive E coli (EIEC) NOT DETECTED NOT DETECTED Final   Cryptosporidium NOT DETECTED NOT DETECTED Final   Cyclospora cayetanensis NOT DETECTED NOT DETECTED Final   Entamoeba histolytica NOT DETECTED NOT DETECTED Final   Giardia lamblia NOT DETECTED NOT DETECTED Final   Adenovirus F40/41 NOT DETECTED NOT DETECTED Final   Astrovirus NOT DETECTED NOT DETECTED Final   Norovirus GI/GII NOT DETECTED NOT DETECTED Final   Rotavirus A NOT DETECTED NOT DETECTED Final   Sapovirus (I, II, IV, and V) NOT DETECTED NOT DETECTED Final    Comment: Performed at [facility], [address] [place], [place] [address]  SARS Coronavirus 2 (CEPHEID - Performed in [facility] hospital lab), Hosp Order     Status: None   Collection Time: 02/10/19 12:52 PM   Specimen: Nasopharyngeal Swab  Result Value Ref Range Status   SARS Coronavirus 2 NEGATIVE NEGATIVE Final    Comment: (NOTE) If result is NEGATIVE SARS-CoV-2 target nucleic acids are NOT DETECTED. The SARS-CoV-2 RNA is generally detectable in upper and lower  respiratory specimens during the acute phase of infection. The lowest  concentration of SARS-CoV-2 viral copies this assay can detect is 250  copies / mL. A negative result does not preclude SARS-CoV-2 infection  and should not be used as the sole basis for treatment or other  patient management decisions.  A negative result may occur with  improper specimen collection / handling, submission of specimen other  than nasopharyngeal swab, presence of viral mutation(s) within the  areas targeted by this assay, and inadequate number of viral copies  (<250 copies / mL). A negative result must be combined with clinical  observations, patient history, and epidemiological  information. If result is POSITIVE SARS-CoV-2 target nucleic acids are DETECTED. The SARS-CoV-2 RNA is generally detectable in upper and lower  respiratory specimens dur ing the acute phase of infection.  Positive  results are indicative of active infection with SARS-CoV-2.  Clinical  correlation with patient history and other diagnostic information is  necessary to determine patient infection status.  Positive results do  not rule out bacterial infection or co-infection with other viruses. If result is PRESUMPTIVE POSTIVE SARS-CoV-2 nucleic acids MAY BE PRESENT.   A presumptive positive result was obtained on the submitted specimen  and confirmed on repeat testing.  While 2019 novel coronavirus  (SARS-CoV-2) nucleic acids may be present in the submitted sample  additional confirmatory testing may be necessary for epidemiological  and / or clinical management purposes  to differentiate between  SARS-CoV-2 and other Sarbecovirus currently known to infect humans.  If clinically indicated additional testing with an alternate test  methodology [phone]([id number]) is advised.  The SARS-CoV-2 RNA is generally  detectable in upper and lower respiratory sp ecimens during the acute  phase of infection. The expected result is Negative. Fact Sheet for Patients:  BoilerBrush.com.cyhttps://www.fda.gov/media/136312/download Fact Sheet for Healthcare Providers: https://pope.com/https://www.fda.gov/media/136313/download This test is not yet approved or cleared by the [place] FDA and has been authorized for detection and/or diagnosis of SARS-CoV-2 by FDA under an Emergency Use Authorization (EUA).  This EUA will remain in effect (meaning this test can be used) for the duration of the COVID-19 declaration under Section 564(b)(1) of the Act, 21 U.S.C. section 360bbb-3(b)(1), unless the authorization is terminated or revoked sooner. Performed at [facility], [address] [place], [place] [address]   C difficile quick scan w PCR  reflex     Status: None   Collection Time: 02/10/19  3:45 PM   Specimen: STOOL  Result Value Ref Range Status   C Diff antigen NEGATIVE NEGATIVE Final   C Diff toxin NEGATIVE NEGATIVE Final   C Diff interpretation No C. difficile detected.  Final    Comment: NEGATIVE Performed at [facility], [address] [place], [place] [address]      Labs: Basic Metabolic Panel: Recent Labs  Lab 02/10/19 1231 02/10/19 1930 02/11/19 0852 02/12/19 0755 02/13/19 0701  NA 136 136 137 138 142  K 2.6* 2.7* 2.9* 2.8* 3.0*  CL 105 110 112* 114* 116*  CO2 17* 16* 16* 16* 17*  GLUCOSE 124* 132* 119* 124* 102*  BUN 36* 31* 23 12 7*  CREATININE 2.00* 1.59* 1.23* 1.08* 0.83  CALCIUM 9.0 8.8* 8.7* 8.8* 8.7*  MG 2.0  --  1.8  --   --   PHOS  --  2.6 2.0*  --   --    Liver Function Tests: Recent Labs  Lab 02/10/19 1231 02/11/19 0852  AST 23 21  ALT 27 24  ALKPHOS 51 51  BILITOT 0.7 0.6  PROT 6.8 6.1*  ALBUMIN 3.6 3.1*   No results for input(s): LIPASE, AMYLASE in the last 168 hours. No results for input(s): AMMONIA in the last 168 hours. CBC: Recent Labs  Lab 02/10/19 0942 02/11/19 0852 02/12/19 0755  WBC 4.9 4.6 5.1  NEUTROABS  --   --  1.8  HGB 14.3 11.9* 12.8  HCT 42.9 35.5* 38.6  MCV 83.8 84.5 84.6  PLT 311 251 274   Cardiac Enzymes: Recent Labs  Lab 02/10/19 1930  CKTOTAL 39   BNP: BNP (last 3 results) No results for input(s): BNP in the last 8760 hours.  ProBNP (last 3 results) No results for input(s): PROBNP in the last 8760 hours.  CBG: No results for input(s): GLUCAP in the last 168 hours.     Signed:  Briant CedarNkeiruka J Gaynelle Pastrana, MD [facility] 02/13/2019, 11:17 AM

## 2019-02-13 NOTE — Plan of Care (Signed)
Care plan complete, patient discharging home.

## 2019-02-13 NOTE — Progress Notes (Signed)
DISCHARGE NOTE HOME Kinslei Labine to be discharged home per MD order. Discussed prescriptions and follow up appointments with the patient. Prescriptions given to patient; medication list explained in detail. Patient verbalized understanding.  Skin clean, dry and intact without evidence of skin break down, no evidence of skin tears noted. IV catheter discontinued intact. Site without signs and symptoms of complications. Dressing and pressure applied. Pt denies pain at the site currently. No complaints noted.  Patient free of lines, drains, and wounds.   An After Visit Summary (AVS) was printed and given to the patient. Patient escorted via wheelchair, and discharged home via private auto.  Stephan Minister, RN

## 2019-10-05 ENCOUNTER — Ambulatory Visit: Payer: Self-pay | Attending: Internal Medicine

## 2019-10-05 DIAGNOSIS — Z23 Encounter for immunization: Secondary | ICD-10-CM

## 2019-10-05 NOTE — Progress Notes (Signed)
   Covid-19 Vaccination Clinic  Name:  Jasmine Branch    MRN: [id number] DOB: [birth year]  10/05/2019  [name] was observed post Covid-19 immunization for 15 minutes without incident. She was provided with Vaccine Information Sheet and instruction to access the V-Safe system.   [name] was instructed to call 911 with any severe reactions post vaccine: Marland Kitchen Difficulty breathing  . Swelling of face and throat  . A fast heartbeat  . A bad rash all over body  . Dizziness and weakness   Immunizations Administered    Name Date Dose VIS Date Route   Pfizer COVID-19 Vaccine 10/05/2019  9:05 AM 0.3 mL 06/30/2019 Intramuscular   Manufacturer: [employer], [employer]   Lot: [id number]   NDC: [address]-5

## 2019-10-30 ENCOUNTER — Ambulatory Visit: Payer: Self-pay | Attending: Internal Medicine

## 2019-10-30 DIAGNOSIS — Z23 Encounter for immunization: Secondary | ICD-10-CM

## 2019-10-30 NOTE — Progress Notes (Signed)
   Covid-19 Vaccination Clinic  Name:  Jasmine Branch    MRN: [id number] DOB: 12-22-1955  10/30/2019  [name] was observed post Covid-19 immunization for 15 minutes without incident. She was provided with Vaccine Information Sheet and instruction to access the V-Safe system.   [name] was instructed to call 911 with any severe reactions post vaccine: Marland Kitchen Difficulty breathing  . Swelling of face and throat  . A fast heartbeat  . A bad rash all over body  . Dizziness and weakness   Immunizations Administered    Name Date Dose VIS Date Route   Pfizer COVID-19 Vaccine 10/30/2019  8:42 AM 0.3 mL 06/30/2019 Intramuscular   Manufacturer: [employer], [employer]   Lot: [id number]   NDC: [address]-8

## 2020-05-09 IMAGING — CT CT ABDOMEN AND PELVIS WITH CONTRAST
2 of 5 series · 15 of 46 positions shown, 17 images · IV contrast (Omni 300)
Comparison: None.

CLINICAL DATA: 63-year-old female with right lower quadrant
abdominal pain.

EXAM:
CT ABDOMEN AND PELVIS WITH CONTRAST
TECHNIQUE: Multidetector CT imaging of the abdomen and pelvis was performed
using the standard protocol following bolus administration of
intravenous contrast.
CONTRAST:  100mL OMNIPAQUE IOHEXOL 300 MG/ML  SOLN

[Series 3: a/p w/ 5mm · axial · 0.95mm/px · z∈[-508,-93]mm · 12 of 95 slices shown, 14 images]
[im 6/95  soft-tissue]
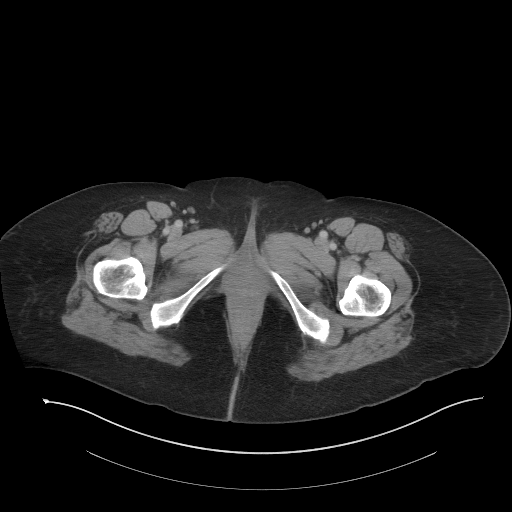
[im 6/95  bone]
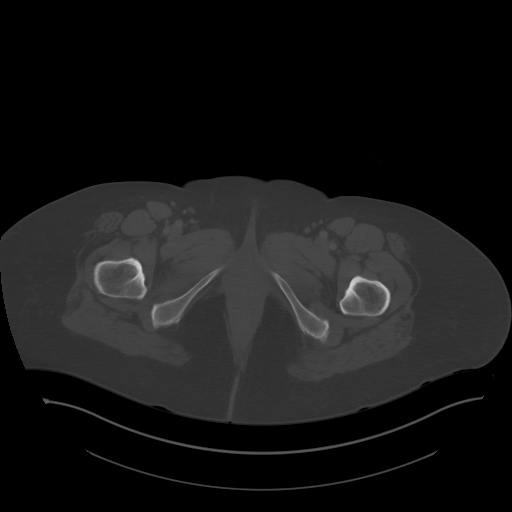
[im 12/95  soft-tissue]
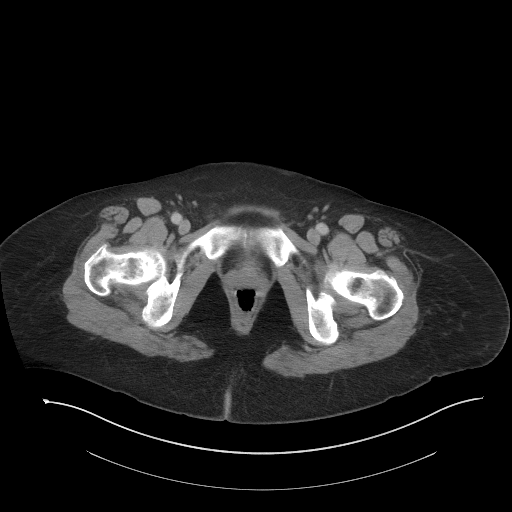
[im 24/95  soft-tissue]
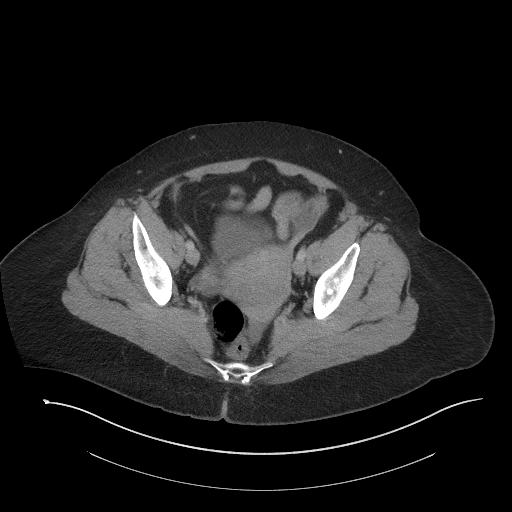
[im 30/95  soft-tissue]
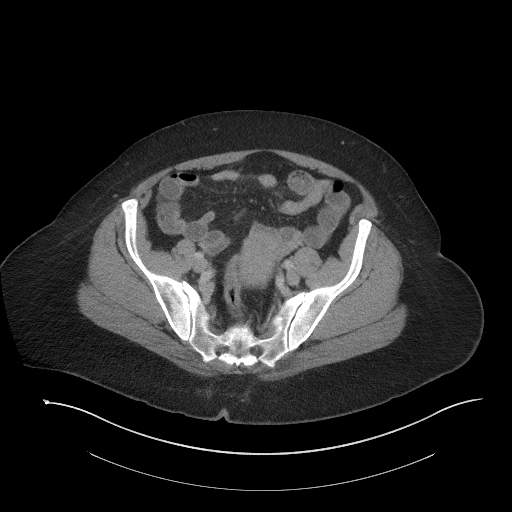
[im 36/95  soft-tissue]
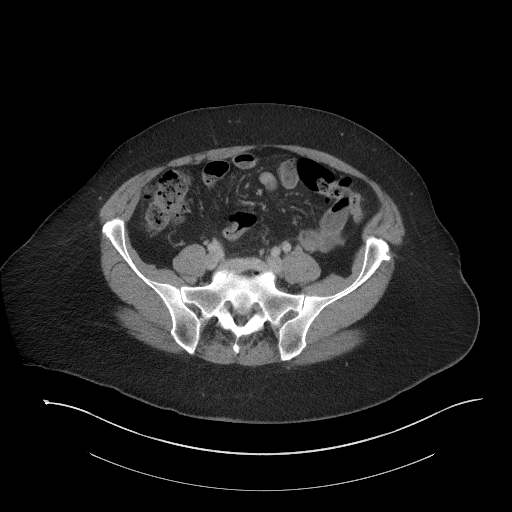
[im 42/95  soft-tissue]
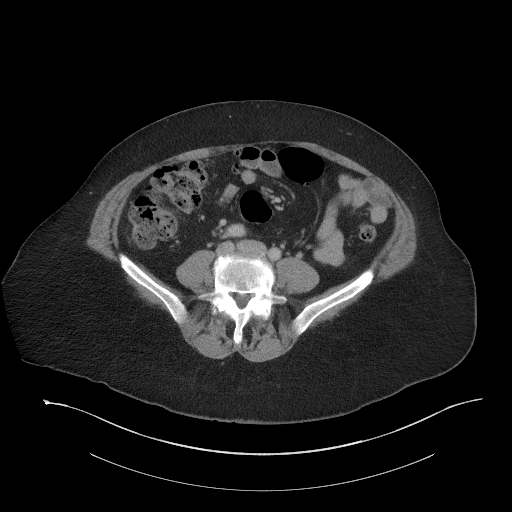
[im 53/95  soft-tissue]
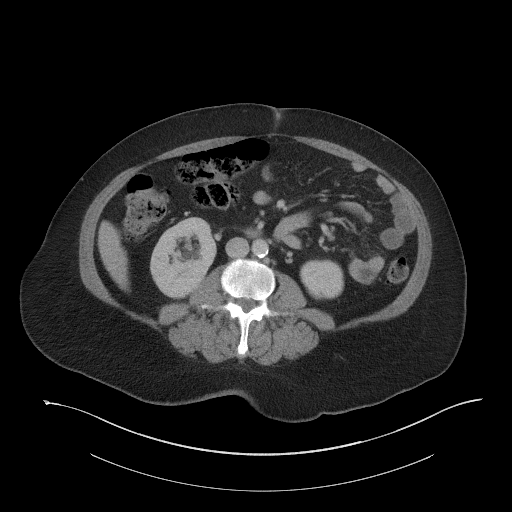
[im 59/95  soft-tissue]
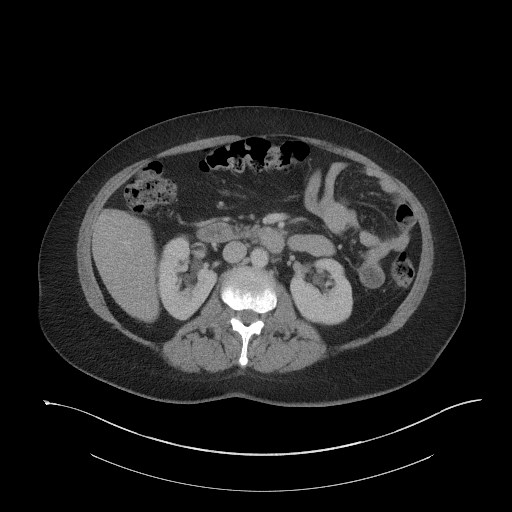
[im 65/95  soft-tissue]
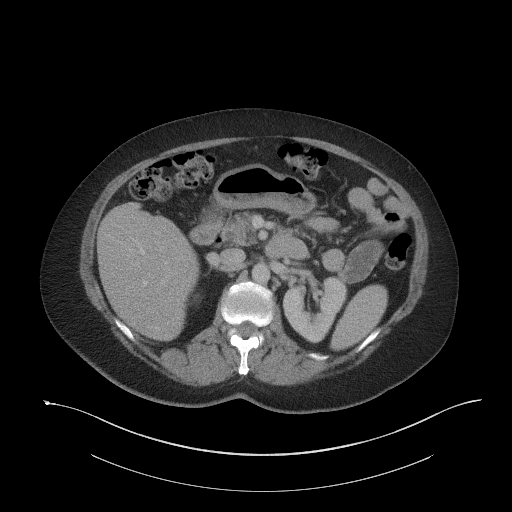
[im 65/95  bone]
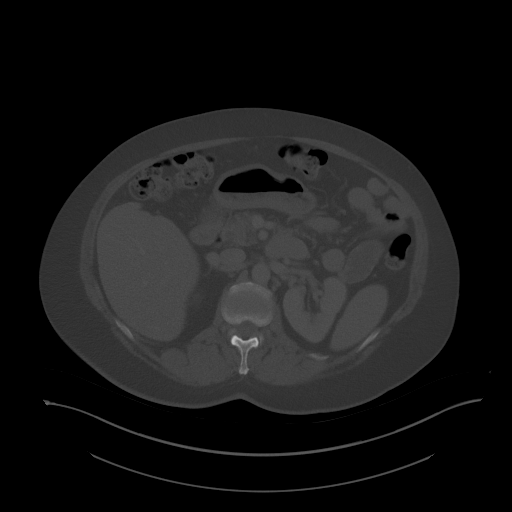
[im 71/95  soft-tissue]
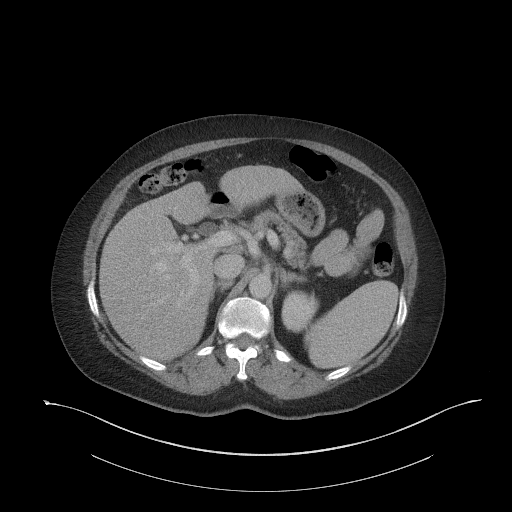
[im 83/95  soft-tissue]
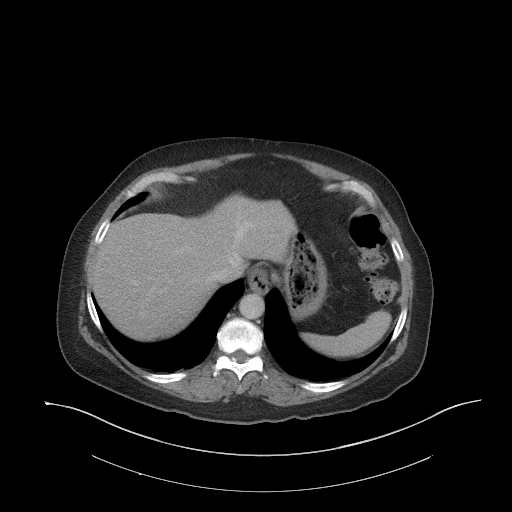
[im 89/95  soft-tissue]
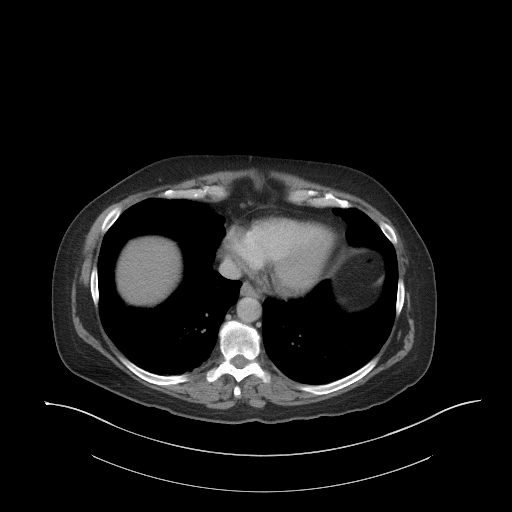

[Series 6: a/p w/ cor · coronal · 0.75mm/px · 3 of 151 slices shown]
[im 51/151  soft-tissue]
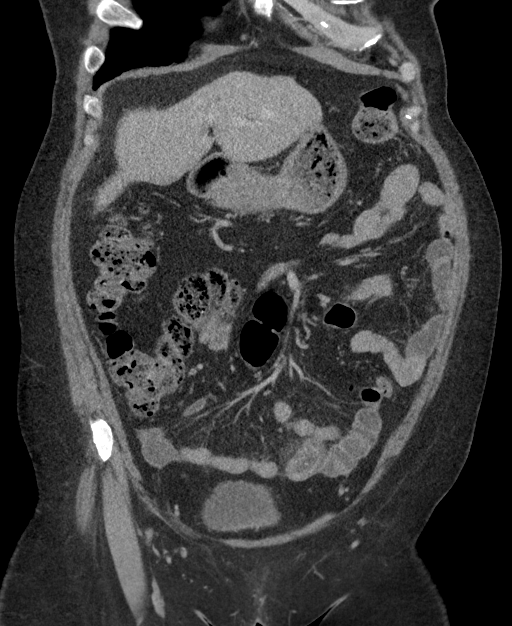
[im 67/151  soft-tissue]
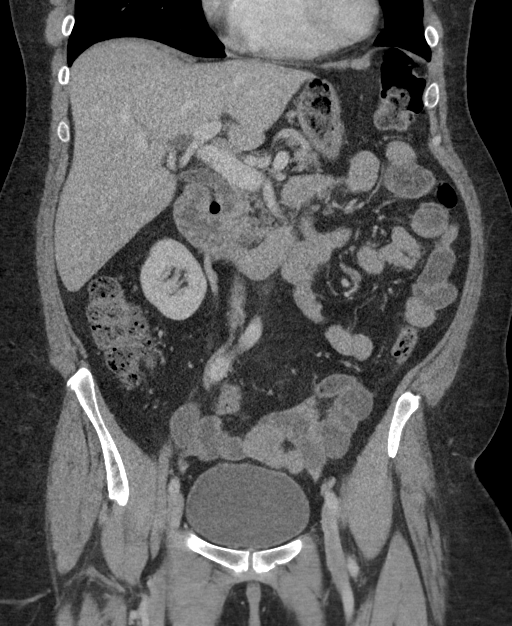
[im 84/151  soft-tissue]
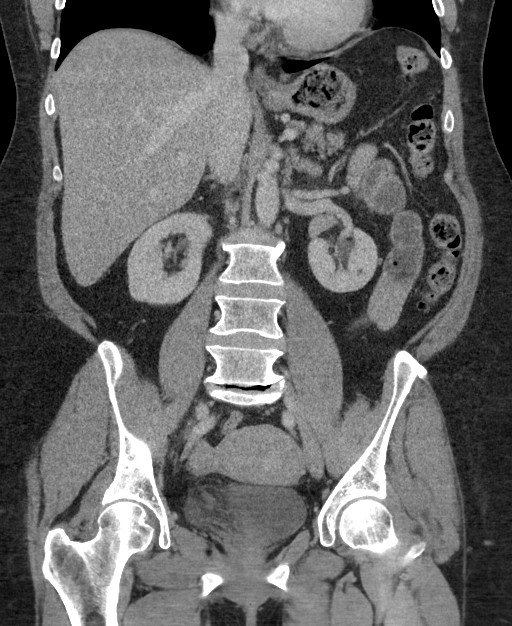

[15 of 46 positions shown; findings below may reference images not displayed]

FINDINGS: Lower chest: Minimal lung base atelectasis. No pericardial or
pleural effusion.

Hepatobiliary: Diminutive or absent gallbladder.  Negative liver.

Pancreas: Negative.

Spleen: Negative.

Adrenals/Urinary Tract: Normal adrenal glands. There is mild
congenital rotation of the right kidney, normal variant. There are
benign bilateral renal parapelvic cysts. There is no hydronephrosis.
Symmetric renal enhancement and contrast excretion with normal
proximal ureters. Unremarkable urinary bladder.

Stomach/Bowel: Negative distal sigmoid and rectum. The proximal
sigmoid is redundant tracking to the level of the umbilicus, but
otherwise negative. There is diverticulosis of the descending colon
which is mild-to-moderate. No active inflammation identified. Mildly
redundant transverse colon is negative. Negative right colon. The
cecum is on a lax mesentery located anteriorly in the right mid
abdomen. The appendix is located just caudal to the cecum in the
ventral abdomen subjacent to the abdominal wall on series 3, image
54 and appears normal.

Negative terminal ileum. But there are mildly thickened and
indistinct loops of distal small bowel in the left lower quadrant on
series 3, image 70. There is associated mild mesenteric stranding
(coronal image 51). Other fluid-filled but nondilated loops in this
region may also be mildly inflamed, including in the right lower
quadrant. There is no associated SMA atherosclerosis identified.
Trace mesenteric free fluid. The mesentery of these affected small
bowel loops also appears somewhat sequestered as seen on coronal
image 51. No free air.

The duodenum and proximal jejunum appear normal. Negative stomach
aside from small hiatal hernia.

Vascular/Lymphatic: Mild Calcified aortic atherosclerosis. Major
arterial structures remain patent. There is no SMA atherosclerosis
identified. SMA branches appear normal. Portal venous system is
patent.

No lymphadenopathy.

Reproductive: Within normal limits.

Other: Small volume pelvic free fluid in the cul-de-sac on series 3,
image 73.

Musculoskeletal: No acute osseous abnormality identified. Benign
bone island of the right iliac wing suspected. Advanced disc
degeneration at the lumbosacral junction.
IMPRESSION: Negative appendix but mildly inflamed distal small bowel loops
located in both lower quadrants. Trace free fluid, no free air.

The inflamed loops appear somewhat distinct from the remaining small
bowel, and the cecum is also on a lax mesentery located in the
ventral right abdomen.

I favor acute infectious enteritis at this time. But if the
patient's condition worsens consider developing closed loop
obstruction such as due to internal hernia.

## 2020-07-01 ENCOUNTER — Ambulatory Visit: Payer: Self-pay | Attending: Internal Medicine

## 2020-07-01 DIAGNOSIS — Z23 Encounter for immunization: Secondary | ICD-10-CM

## 2020-07-01 NOTE — Progress Notes (Signed)
   Covid-19 Vaccination Clinic  Name:  Jasmine Branch    MRN: [id number] DOB: [birth year]  07/01/2020  Jasmine Branch was observed post Covid-19 immunization for 15 minutes without incident. She was provided with Vaccine Information Sheet and instruction to access the V-Safe system.   Jasmine Branch was instructed to call 911 with any severe reactions post vaccine: Marland Kitchen Difficulty breathing  . Swelling of face and throat  . A fast heartbeat  . A bad rash all over body  . Dizziness and weakness   Immunizations Administered    No immunizations on file.

## 2020-07-21 IMAGING — CT CT ABDOMEN AND PELVIS WITHOUT CONTRAST
2 of 4 series · 16 of 46 positions shown, 18 images · non-contrast
Comparison: November 30, 2018

CLINICAL DATA: Persistent diarrhea.

EXAM:
CT ABDOMEN AND PELVIS WITHOUT CONTRAST
TECHNIQUE: Multidetector CT imaging of the abdomen and pelvis was performed
following the standard protocol without IV contrast.

[Series 5: a/p w/o cor · coronal · non-contrast · 0.94mm/px · 3 of 166 slices shown]
[im 56/166  soft-tissue]
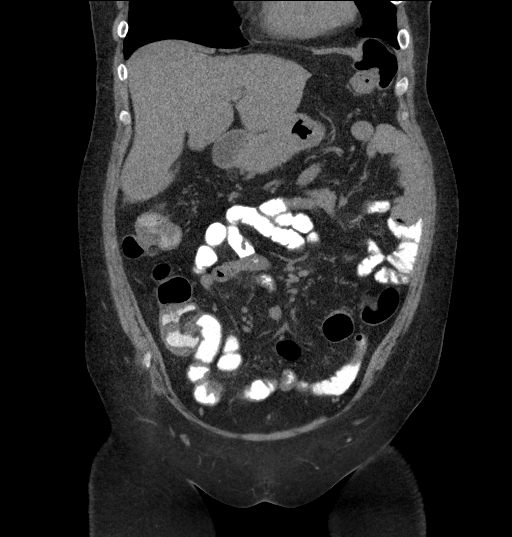
[im 74/166  soft-tissue]
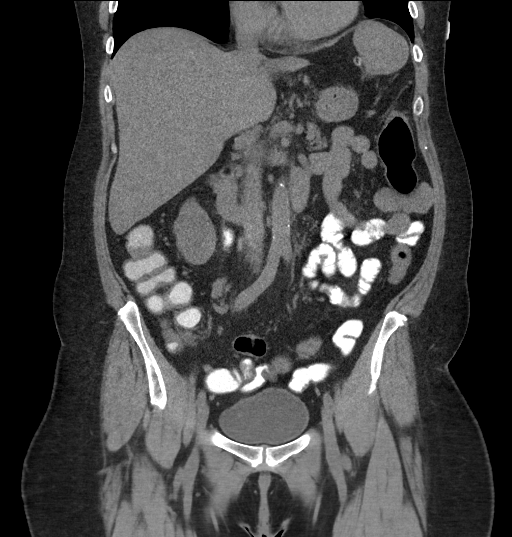
[im 92/166  soft-tissue]
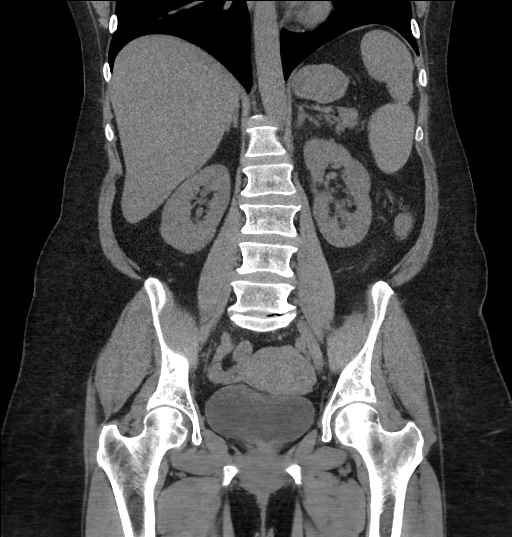

[Series 7: a/p w/o 5mm · axial · non-contrast · 0.96mm/px · z∈[+776,+1236]mm · 13 of 102 slices shown, 15 images]
[im 5/102  soft-tissue]
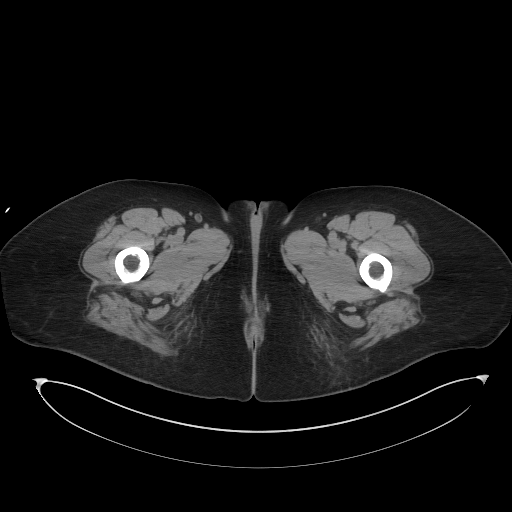
[im 5/102  bone]
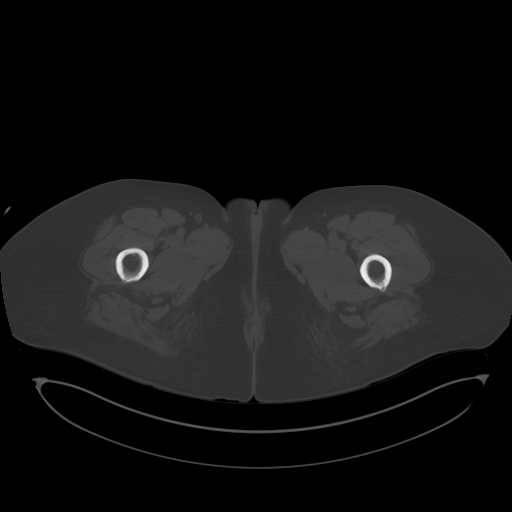
[im 13/102  soft-tissue]
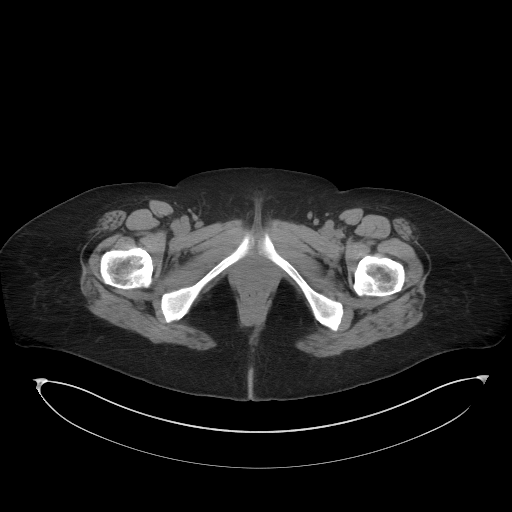
[im 21/102  soft-tissue]
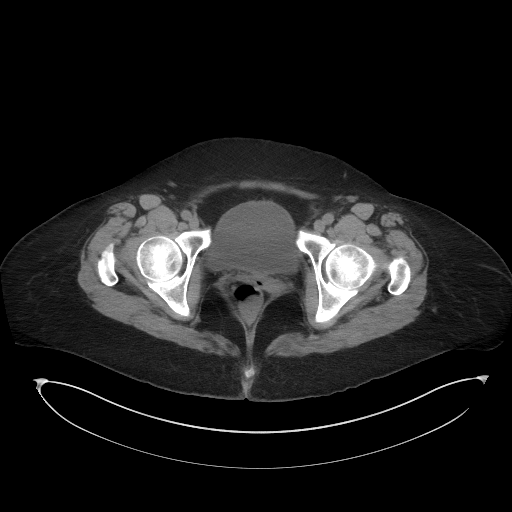
[im 29/102  soft-tissue]
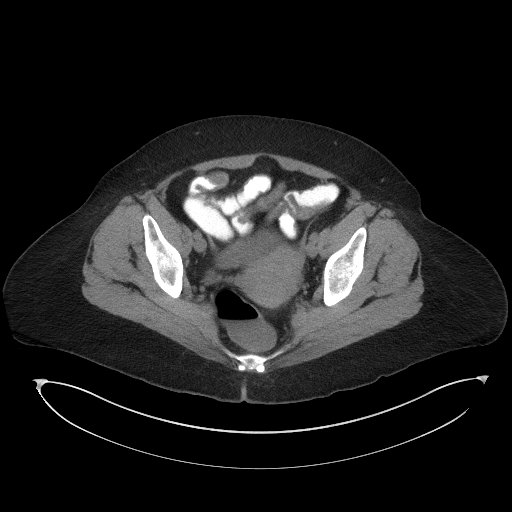
[im 37/102  soft-tissue]
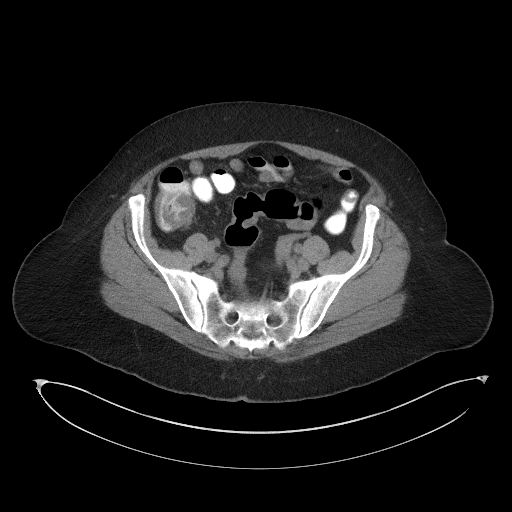
[im 45/102  soft-tissue]
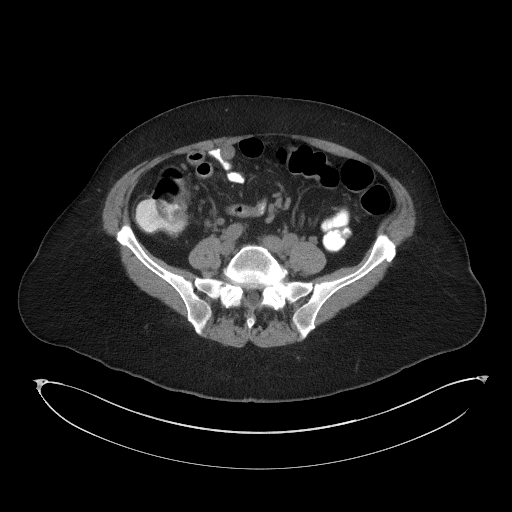
[im 53/102  soft-tissue]
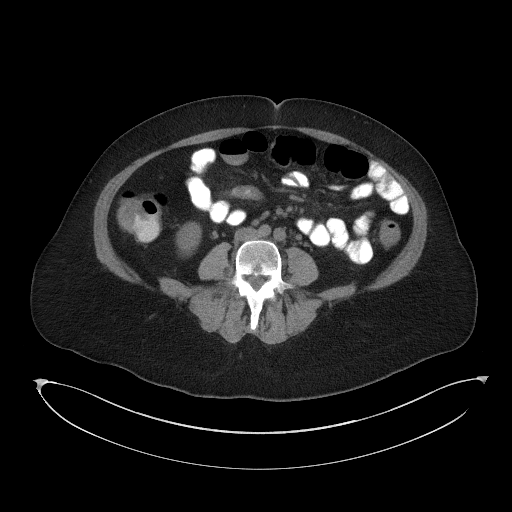
[im 57/102  soft-tissue]
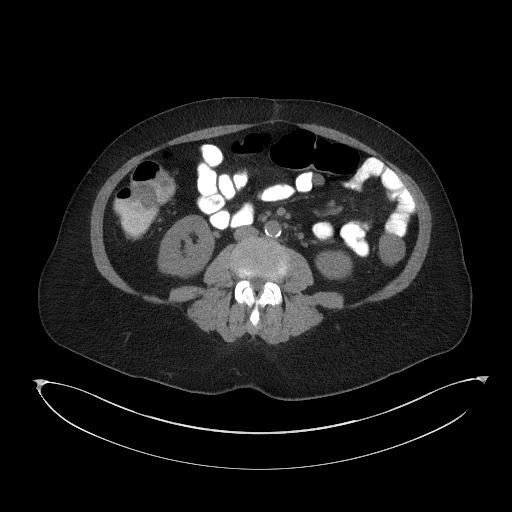
[im 65/102  soft-tissue]
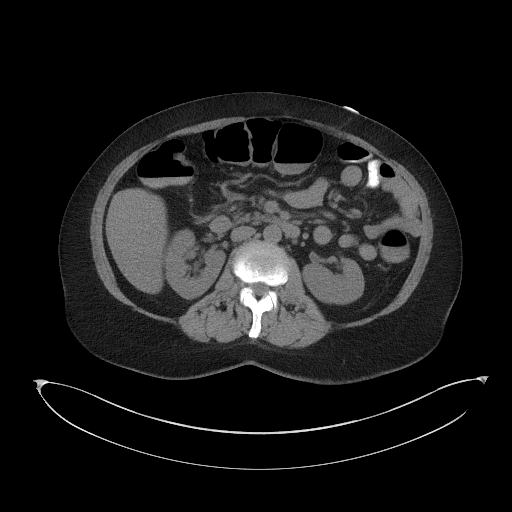
[im 65/102  bone]
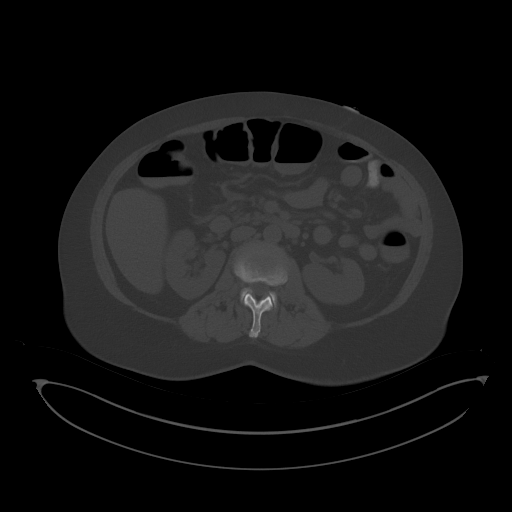
[im 73/102  soft-tissue]
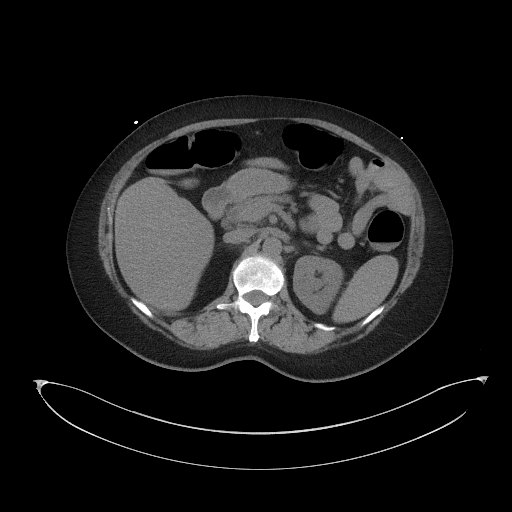
[im 81/102  soft-tissue]
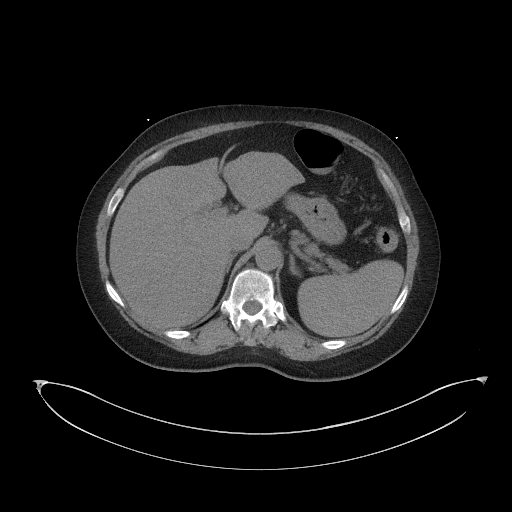
[im 89/102  soft-tissue]
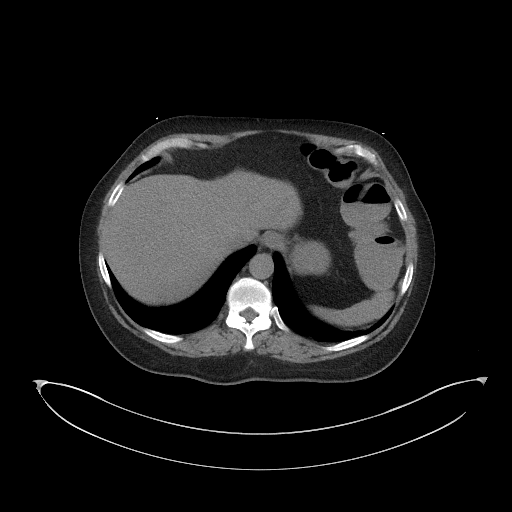
[im 97/102  soft-tissue]
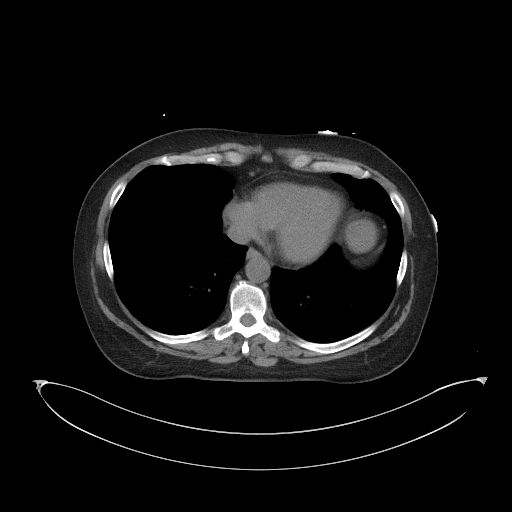

[16 of 46 positions shown; findings below may reference images not displayed]

FINDINGS: Lower chest: The lung bases are clear. The heart size is normal.

Hepatobiliary: There is decreased hepatic attenuation suggestive of
hepatic steatosis. Status post cholecystectomy.There is no biliary
ductal dilation.

Pancreas: Normal contours without ductal dilatation. No
peripancreatic fluid collection.

Spleen: No splenic laceration or hematoma.

Adrenals/Urinary Tract:

--Adrenal glands: No adrenal hemorrhage.

--Right kidney/ureter: No hydronephrosis or perinephric hematoma.

--Left kidney/ureter: No hydronephrosis or perinephric hematoma.

--Urinary bladder: Unremarkable.

Stomach/Bowel:

--Stomach/Duodenum: No hiatal hernia or other gastric abnormality.
Normal duodenal course and caliber.

--Small bowel: No dilatation or inflammation.

--Colon: There is liquid stool in the colon. There are few scattered
colonic diverticula without CT evidence of diverticulitis.

--Appendix: Normal.

Vascular/Lymphatic: Atherosclerotic calcification is present within
the non-aneurysmal abdominal aorta, without hemodynamically
significant stenosis.

--No retroperitoneal lymphadenopathy.

--No mesenteric lymphadenopathy.

--No pelvic or inguinal lymphadenopathy.

Reproductive: There is a fibroid uterus.

Other: No ascites or free air. The abdominal wall is normal.

Musculoskeletal. No acute displaced fractures.
IMPRESSION: 1. Liquid stool consistent with diarrhea.
2. Hepatic steatosis.
3. Fibroid uterus.
4. Normal appendix in the right lower quadrant. No CT evidence for
enteritis, however evaluation is limited by lack of IV contrast.

## 2020-12-27 ENCOUNTER — Other Ambulatory Visit: Payer: Self-pay

## 2020-12-27 ENCOUNTER — Ambulatory Visit: Payer: Self-pay | Attending: Internal Medicine

## 2020-12-27 ENCOUNTER — Other Ambulatory Visit (HOSPITAL_BASED_OUTPATIENT_CLINIC_OR_DEPARTMENT_OTHER): Payer: Self-pay

## 2020-12-27 DIAGNOSIS — Z23 Encounter for immunization: Secondary | ICD-10-CM

## 2020-12-27 MED ORDER — COVID-19 MRNA VACC (MODERNA) 100 MCG/0.5ML IM SUSP
INTRAMUSCULAR | 0 refills | Status: AC
  Filled 2020-12-27: qty 0.25, 1d supply, fill #0

## 2020-12-27 NOTE — Progress Notes (Signed)
   Covid-19 Vaccination Clinic  Name:  Jasmine Branch    MRN: [id number] DOB: [birth year]  12/27/2020  [name] was observed post Covid-19 immunization for 15 minutes without incident. She was provided with Vaccine Information Sheet and instruction to access the V-Safe system.   [name] was instructed to call 911 with any severe reactions post vaccine: Difficulty breathing  Swelling of face and throat  A fast heartbeat  A bad rash all over body  Dizziness and weakness   Immunizations Administered     Name Date Dose VIS Date Route   Moderna Covid-19 Booster Vaccine 12/27/2020  2:26 PM 0.25 mL 05/08/2020 Intramuscular   Manufacturer: Moderna   Lot: [id number]   NDC: 59163-846-65

## 2022-12-28 ENCOUNTER — Emergency Department (HOSPITAL_BASED_OUTPATIENT_CLINIC_OR_DEPARTMENT_OTHER): Payer: 59 | Admitting: Radiology

## 2022-12-28 ENCOUNTER — Other Ambulatory Visit: Payer: Self-pay

## 2022-12-28 ENCOUNTER — Encounter (HOSPITAL_BASED_OUTPATIENT_CLINIC_OR_DEPARTMENT_OTHER): Payer: Self-pay | Admitting: Emergency Medicine

## 2022-12-28 ENCOUNTER — Emergency Department (HOSPITAL_BASED_OUTPATIENT_CLINIC_OR_DEPARTMENT_OTHER)
Admission: EM | Admit: 2022-12-28 | Discharge: 2022-12-28 | Disposition: A | Discharge status: Home / Self Care | Payer: 59 | Attending: Emergency Medicine | Admitting: Emergency Medicine

## 2022-12-28 DIAGNOSIS — Z87891 Personal history of nicotine dependence: Secondary | ICD-10-CM | POA: Diagnosis not present

## 2022-12-28 DIAGNOSIS — R079 Chest pain, unspecified: Secondary | ICD-10-CM | POA: Diagnosis present

## 2022-12-28 LAB — CBC
HCT: 37.6 % (ref 36.0–46.0)
Hemoglobin: 11.9 g/dL — ABNORMAL LOW (ref 12.0–15.0)
MCH: 25 pg — ABNORMAL LOW (ref 26.0–34.0)
MCHC: 31.6 g/dL (ref 30.0–36.0)
MCV: 79 fL — ABNORMAL LOW (ref 80.0–100.0)
Platelets: 220 10*3/uL (ref 150–400)
RBC: 4.76 MIL/uL (ref 3.87–5.11)
RDW: 15.5 % (ref 11.5–15.5)
WBC: 4.8 10*3/uL (ref 4.0–10.5)
nRBC: 0 % (ref 0.0–0.2)

## 2022-12-28 LAB — BASIC METABOLIC PANEL
Anion gap: 9 (ref 5–15)
BUN: 16 mg/dL (ref 8–23)
CO2: 23 mmol/L (ref 22–32)
Calcium: 9.3 mg/dL (ref 8.9–10.3)
Chloride: 107 mmol/L (ref 98–111)
Creatinine, Ser: 0.7 mg/dL (ref 0.44–1.00)
GFR, Estimated: >60 mL/min (ref >60)
Glucose, Bld: 113 mg/dL — ABNORMAL HIGH (ref 70–99)
Potassium: 4.1 mmol/L (ref 3.5–5.1)
Sodium: 139 mmol/L (ref 135–145)

## 2022-12-28 LAB — D-DIMER, QUANTITATIVE: D-Dimer, Quant: 0.62 ug/mL-FEU — ABNORMAL HIGH (ref 0.00–0.50)

## 2022-12-28 LAB — TROPONIN I (HIGH SENSITIVITY): Troponin I (High Sensitivity): <2 ng/L (ref <18)

## 2022-12-28 NOTE — ED Notes (Signed)
Patient verbalizes understanding of discharge instructions. Opportunity for questioning and answers were provided. Patient discharged from ED.  °

## 2022-12-28 NOTE — Discharge Instructions (Addendum)
Please follow-up with a family doctor in the office.  Your workup today was reassuring, makes it very unlikely that this is either heart attack or a blood clot in the lung.  We are going to treat you for possible reflux.  Please follow-up with your doctor in the office.  They may want to send you to a cardiologist based on your history or perhaps they want to send you to a GI doctor if symptoms persist.  Please return for worsening symptoms especially upon exertion.   Try pepcid or tagamet up to twice a day.  Try to avoid things that may make this worse, most commonly these are spicy foods tomato based products fatty foods chocolate and peppermint.  Alcohol and tobacco can also make this worse.  Return to the emergency department for sudden worsening pain fever or inability to eat or drink.

## 2022-12-28 NOTE — ED Provider Notes (Signed)
[facility] AT [facility] Provider Note   CSN: [id number] Arrival date & time: 12/28/22  1000     History  Chief Complaint  Patient presents with   Chest Pain    Jasmine Branch is a 67 y.o. female.  67 yo F with a chief complaint of chest pain.  Is been off and on for a week.  Seems to come and go at random.  Describes it as mild feels like it goes into her shoulders and sometimes into her back.  She has had a little bit of a cough over the past couple days and some congestion as well.  No fevers or chills.  Has intermittent difficulty breathing.  She has a remote smoking history.  Otherwise denies medical problems.  Her family members have had heart attacks in the past but she attributes this to their diet.  Patient denies history of MI, denies hypertension hyperlipidemia diabetes.  She has a remote history of smoking.  Multiple family members with MIs.  Patient denies history of PE or DVT denies hemoptysis denies unilateral lower extremity edema denies recent surgery immobilization hospitalization estrogen use or history of cancer.     Chest Pain      Home Medications Prior to Admission medications   Medication Sig Start Date End Date Taking? Authorizing Provider  Ascorbic Acid (VITAMIN C PO) Take 1 tablet by mouth daily.    [provider]  COVID-19 mRNA vaccine, Moderna, 100 MCG/0.5ML injection Inject into the muscle. 12/27/20   Judyann Munson, MD  Cyanocobalamin (VITAMIN B-12 PO) Take 1 tablet by mouth daily.    [provider]  MAGNESIUM PO Take 1 tablet by mouth daily.    [provider]  OVER THE COUNTER MEDICATION Take 1 tablet by mouth daily. "blood builder" from [employer], Historical, MD  OVER THE COUNTER MEDICATION Take 1 tablet by mouth daily. "wheat grass" from [employer]    [provider]  potassium chloride SA (K-DUR) 20 MEQ tablet Take 1 tablet (20 mEq total) by mouth 2  (two) times daily. 02/13/19   Briant Cedar, MD  SPIRULINA PO Take 1 tablet by mouth daily.    [provider]      Allergies    Patient has no known allergies.    Review of Systems   Review of Systems  Cardiovascular:  Positive for chest pain.    Physical Exam Updated Vital Signs BP 122/75 (BP Location: Right Arm)   Pulse 70   Temp 97.7 F (36.5 C)   Resp 16   Ht 5\' 9"  (1.753 m)   Wt 79.4 kg   SpO2 99%   BMI 25.84 kg/m  Physical Exam Vitals and nursing note reviewed.  Constitutional:      General: She is not in acute distress.    Appearance: She is well-developed. She is not diaphoretic.  HENT:     Head: Normocephalic and atraumatic.  Eyes:     Pupils: Pupils are equal, round, and reactive to light.  Cardiovascular:     Rate and Rhythm: Normal rate and regular rhythm.     Heart sounds: No murmur heard.    No friction rub. No gallop.  Pulmonary:     Effort: Pulmonary effort is normal.     Breath sounds: No wheezing or rales.  Abdominal:     General: There is no distension.     Palpations: Abdomen is soft.  Tenderness: There is no abdominal tenderness.  Musculoskeletal:        General: No tenderness.     Cervical back: Normal range of motion and neck supple.  Skin:    General: Skin is warm and dry.  Neurological:     Mental Status: She is alert and oriented to person, place, and time.  Psychiatric:        Behavior: Behavior normal.     ED Results / Procedures / Treatments   Labs (all labs ordered are listed, but only abnormal results are displayed) Labs Reviewed  BASIC METABOLIC PANEL - Abnormal; Notable for the following components:      Result Value   Glucose, Bld 113 (*)    All other components within normal limits  CBC - Abnormal; Notable for the following components:   Hemoglobin 11.9 (*)    MCV 79.0 (*)    MCH 25.0 (*)    All other components within normal limits  D-DIMER, QUANTITATIVE - Abnormal; Notable for the following  components:   D-Dimer, Quant 0.62 (*)    All other components within normal limits  TROPONIN I (HIGH SENSITIVITY)    EKG EKG Interpretation  Date/Time:  Monday December 28 2022 10:06:46 EDT Ventricular Rate:  73 PR Interval:  144 QRS Duration: 88 QT Interval:  414 QTC Calculation: 456 R Axis:   67 Text Interpretation: Normal sinus rhythm Nonspecific ST abnormality Abnormal ECG Baseline wander TECHNICALLY DIFFICULT Otherwise no significant change Confirmed by Melene Plan [phone]) on 12/28/2022 10:33:48 AM  Radiology DG Chest 2 View  Result Date: 12/28/2022 CLINICAL DATA:  Chest pain EXAM: CHEST - 2 VIEW COMPARISON:  None Available. FINDINGS: The heart size and mediastinal contours are within normal limits. Both lungs are clear. The visualized skeletal structures are unremarkable. IMPRESSION: No active cardiopulmonary disease. Electronically Signed   By: Allegra Lai M.D.   On: 12/28/2022 10:59    Procedures Procedures    Medications Ordered in ED Medications - No data to display  ED Course/ Medical Decision Making/ A&P                             Medical Decision Making Amount and/or Complexity of Data Reviewed Labs: ordered. Radiology: ordered.   67 yo F with a chief complaints of left-sided chest pain this is atypical in nature and seems to come and go.  She has been on the exercise and go on hikes without any issue.  This been going on for a week without significant change in the past 6 hours.  Troponin is completely negative.  Feels is unlikely to be ACS by history and physical as well.  I did obtain a D-dimer which is age-adjusted negative.  Chest x-ray independently interpreted by me without focal infiltrate or pneumothorax.  No acute anemia, no significant electrolyte abnormalities.  Will discharge the patient home.  PCP follow-up.  11:22 AM:  I have discussed the diagnosis/risks/treatment options with the patient.  Evaluation and diagnostic testing in the emergency  department does not suggest an emergent condition requiring admission or immediate intervention beyond what has been performed at this time.  They will follow up with PCP. We also discussed returning to the ED immediately if new or worsening sx occur. We discussed the sx which are most concerning (e.g., sudden worsening pain, fever, inability to tolerate by mouth) that necessitate immediate return. Medications administered to the patient during their visit and any new prescriptions  provided to the patient are listed below.  Medications given during this visit Medications - No data to display   The patient appears reasonably screen and/or stabilized for discharge and I doubt any other medical condition or other [facility] requiring further screening, evaluation, or treatment in the ED at this time prior to discharge.          Final Clinical Impression(s) / ED Diagnoses Final diagnoses:  Nonspecific chest pain    Rx / DC Orders ED Discharge Orders     None         Melene Plan, DO 12/28/22 1122

## 2022-12-28 NOTE — ED Triage Notes (Signed)
Pt arrives to ED with c/o intermittent chest pain x1 week. She notes radiation to left arm, neck, and back.

## 2024-05-19 ENCOUNTER — Other Ambulatory Visit (HOSPITAL_COMMUNITY): Payer: Self-pay

## 2024-05-19 MED ORDER — BOOSTRIX 5-2.5-18.5 LF-MCG/0.5 IM SUSY
0.5000 mL | PREFILLED_SYRINGE | Freq: Once | INTRAMUSCULAR | 0 refills | Status: AC
  Filled 2024-05-19: qty 0.5, 1d supply, fill #0

## 2024-05-19 MED ORDER — RSVPREF3 VAC RECOMB ADJUVANTED 120 MCG/0.5ML IM SUSR
0.5000 mL | Freq: Once | INTRAMUSCULAR | 0 refills | Status: AC
  Filled 2024-05-19: qty 0.5, 1d supply, fill #0

## 2024-05-19 MED ORDER — FLUZONE HIGH-DOSE 0.5 ML IM SUSY
0.5000 mL | PREFILLED_SYRINGE | Freq: Once | INTRAMUSCULAR | 0 refills | Status: AC
  Filled 2024-05-19: qty 0.5, 1d supply, fill #0

## 2024-05-30 ENCOUNTER — Other Ambulatory Visit (HOSPITAL_COMMUNITY): Payer: Self-pay
